# Patient Record
Sex: Female | Born: 1945 | Race: White | Hispanic: No | Marital: Married | Smoking: Current some day smoker
Health system: Southern US, Community
[De-identification: ages and names within clinical notes are randomized; demographics above are authoritative.]

## PROBLEM LIST (undated history)

## (undated) DIAGNOSIS — Z9289 Personal history of other medical treatment: Secondary | ICD-10-CM

## (undated) DIAGNOSIS — K219 Gastro-esophageal reflux disease without esophagitis: Secondary | ICD-10-CM

## (undated) DIAGNOSIS — I10 Essential (primary) hypertension: Secondary | ICD-10-CM

## (undated) DIAGNOSIS — I2510 Atherosclerotic heart disease of native coronary artery without angina pectoris: Secondary | ICD-10-CM

## (undated) DIAGNOSIS — F32A Depression, unspecified: Secondary | ICD-10-CM

## (undated) DIAGNOSIS — E785 Hyperlipidemia, unspecified: Secondary | ICD-10-CM

## (undated) DIAGNOSIS — F329 Major depressive disorder, single episode, unspecified: Secondary | ICD-10-CM

## (undated) DIAGNOSIS — S82401A Unspecified fracture of shaft of right fibula, initial encounter for closed fracture: Secondary | ICD-10-CM

## (undated) DIAGNOSIS — F17200 Nicotine dependence, unspecified, uncomplicated: Secondary | ICD-10-CM

## (undated) DIAGNOSIS — M797 Fibromyalgia: Secondary | ICD-10-CM

## (undated) HISTORY — DX: Personal history of other medical treatment: Z92.89

## (undated) HISTORY — DX: Nicotine dependence, unspecified, uncomplicated: F17.200

## (undated) HISTORY — DX: Depression, unspecified: F32.A

## (undated) HISTORY — DX: Essential (primary) hypertension: I10

## (undated) HISTORY — DX: Unspecified fracture of shaft of right fibula, initial encounter for closed fracture: S82.401A

## (undated) HISTORY — DX: Fibromyalgia: M79.7

## (undated) HISTORY — DX: Gastro-esophageal reflux disease without esophagitis: K21.9

## (undated) HISTORY — DX: Major depressive disorder, single episode, unspecified: F32.9

## (undated) HISTORY — DX: Atherosclerotic heart disease of native coronary artery without angina pectoris: I25.10

## (undated) HISTORY — DX: Hyperlipidemia, unspecified: E78.5

---

## 1999-07-12 ENCOUNTER — Encounter: Payer: Self-pay | Admitting: Obstetrics and Gynecology

## 1999-07-12 ENCOUNTER — Encounter: Admission: RE | Admit: 1999-07-12 | Discharge: 1999-07-12 | Payer: Self-pay | Admitting: Obstetrics and Gynecology

## 2000-10-30 ENCOUNTER — Encounter: Admission: RE | Admit: 2000-10-30 | Discharge: 2000-10-30 | Payer: Self-pay | Admitting: Obstetrics and Gynecology

## 2000-10-30 ENCOUNTER — Encounter: Payer: Self-pay | Admitting: Obstetrics and Gynecology

## 2001-11-26 ENCOUNTER — Encounter: Payer: Self-pay | Admitting: Obstetrics and Gynecology

## 2001-11-26 ENCOUNTER — Encounter: Admission: RE | Admit: 2001-11-26 | Discharge: 2001-11-26 | Payer: Self-pay | Admitting: Obstetrics and Gynecology

## 2001-12-22 ENCOUNTER — Ambulatory Visit (HOSPITAL_COMMUNITY): Admission: RE | Admit: 2001-12-22 | Discharge: 2001-12-23 | Payer: Self-pay | Admitting: Cardiology

## 2002-12-08 ENCOUNTER — Encounter: Payer: Self-pay | Admitting: Obstetrics and Gynecology

## 2002-12-08 ENCOUNTER — Encounter: Admission: RE | Admit: 2002-12-08 | Discharge: 2002-12-08 | Payer: Self-pay | Admitting: Obstetrics and Gynecology

## 2003-08-01 ENCOUNTER — Emergency Department (HOSPITAL_COMMUNITY): Admission: EM | Admit: 2003-08-01 | Discharge: 2003-08-01 | Payer: Self-pay | Admitting: Emergency Medicine

## 2003-08-11 ENCOUNTER — Encounter: Admission: RE | Admit: 2003-08-11 | Discharge: 2003-08-11 | Payer: Self-pay | Admitting: Internal Medicine

## 2004-01-11 ENCOUNTER — Ambulatory Visit (HOSPITAL_COMMUNITY): Admission: RE | Admit: 2004-01-11 | Discharge: 2004-01-11 | Payer: Self-pay | Admitting: Obstetrics and Gynecology

## 2004-10-03 ENCOUNTER — Ambulatory Visit (HOSPITAL_COMMUNITY): Admission: RE | Admit: 2004-10-03 | Discharge: 2004-10-03 | Payer: Self-pay | Admitting: Gastroenterology

## 2005-02-19 ENCOUNTER — Ambulatory Visit (HOSPITAL_COMMUNITY): Admission: RE | Admit: 2005-02-19 | Discharge: 2005-02-19 | Payer: Self-pay | Admitting: Obstetrics and Gynecology

## 2006-03-28 ENCOUNTER — Ambulatory Visit (HOSPITAL_COMMUNITY): Admission: RE | Admit: 2006-03-28 | Discharge: 2006-03-28 | Payer: Self-pay | Admitting: Obstetrics and Gynecology

## 2006-10-29 ENCOUNTER — Ambulatory Visit: Payer: Self-pay | Admitting: Vascular Surgery

## 2006-10-29 ENCOUNTER — Encounter: Payer: Self-pay | Admitting: Vascular Surgery

## 2006-10-29 ENCOUNTER — Ambulatory Visit (HOSPITAL_COMMUNITY): Admission: RE | Admit: 2006-10-29 | Discharge: 2006-10-29 | Payer: Self-pay | Admitting: Cardiology

## 2007-05-15 ENCOUNTER — Ambulatory Visit (HOSPITAL_COMMUNITY): Admission: RE | Admit: 2007-05-15 | Discharge: 2007-05-15 | Payer: Self-pay | Admitting: Obstetrics and Gynecology

## 2007-12-22 ENCOUNTER — Inpatient Hospital Stay (HOSPITAL_COMMUNITY): Admission: AD | Admit: 2007-12-22 | Discharge: 2007-12-24 | Payer: Self-pay | Admitting: Cardiology

## 2008-01-26 ENCOUNTER — Inpatient Hospital Stay (HOSPITAL_COMMUNITY): Admission: RE | Admit: 2008-01-26 | Discharge: 2008-01-27 | Payer: Self-pay | Admitting: Cardiology

## 2008-07-01 ENCOUNTER — Ambulatory Visit (HOSPITAL_COMMUNITY): Admission: RE | Admit: 2008-07-01 | Discharge: 2008-07-01 | Payer: Self-pay | Admitting: Obstetrics and Gynecology

## 2009-07-21 ENCOUNTER — Ambulatory Visit (HOSPITAL_COMMUNITY): Admission: RE | Admit: 2009-07-21 | Discharge: 2009-07-21 | Payer: Self-pay | Admitting: Obstetrics and Gynecology

## 2010-07-26 ENCOUNTER — Ambulatory Visit (HOSPITAL_COMMUNITY)
Admission: RE | Admit: 2010-07-26 | Discharge: 2010-07-26 | Payer: Self-pay | Source: Home / Self Care | Attending: Obstetrics and Gynecology | Admitting: Obstetrics and Gynecology

## 2010-12-25 NOTE — Discharge Summary (Signed)
[name], [name]              ACCOUNT NO.:  [id number]   MEDICAL RECORD NO.:  [id number]          PATIENT TYPE:  INP   LOCATION:  6526                         FACILITY:  [facility]   PHYSICIAN:  Francisca December, M.D.  DATE OF BIRTH:  1946/03/10   DATE OF ADMISSION:  12/22/2007  DATE OF DISCHARGE:  12/24/2007                               DISCHARGE SUMMARY   DISCHARGE DIAGNOSES:  1. Coronary artery disease status post drug-eluting stent to the      proximal left anterior descending, mid left anterior descending,      and proximal ramus.  2. Residual right coronary artery disease.  3. Hypertension.  4. Gastroesophageal reflux disease.  5. Tobacco abuse, smoking cessation counseling provided.  6. Dyslipidemia.  7. Fibromyalgia.  8. Long-term medication use.   HOSPITAL COURSE:  [name] is a 65 year old female that was admitted  on Dec 22, 2007 after having complaints of worsening shortness of  breath.  The shortness of breath was worse with activity.  She denied  any chest pain.  Her symptoms prompted a stress Cardiolite, which  revealed mild ischemia in the anterior lateral and apical lateral  regions.  Therefore, she was admitted for catheterization.  She was  found to have 85% proximal LAD stenosis, 70% mid LAD stenosis, and 90%  ramus stenosis.  Drug-eluting stents were placed into these regions  without difficulty.  The patient does have residual right coronary  artery disease and plans are for her to come back in few weeks to have  this area intervened upon.   During that time, the patient's hospitalization was uneventful.  Lab  studies showed hemoglobin 12.1, hematocrit 35.6, BUN 9, creatinine 0.65,  potassium 4.3.  She was discharged home on her home medications  including Plavix 75 mg a day, estradiol daily, Prozac 20 mg a day,  enteric-coated aspirin 325 mg daily,  Lipitor 80 mg a day, fenofibrate  daily, sublingual nitroglycerin p.r.n. chest pain.   The patient is  instructed not to lift over 10 pounds for 1 week.  No  driving for 2 days.  Increase activity slowly.  Remain on a low-sodium  heart-healthy diet.  Clean over cath site gently with soap and water.  No scrubbing.  Follow with Dr. Deitra Mayo, nurse practitioner  on Jan 07, 2008 at 11 a.m.   The patient will need a workup for percutaneous intervention of the  right coronary artery upon her visit.  At that visit, she will need to  have the procedure scheduled under the care of Dr. Amil Amen.      Guy Franco, P.A.      Francisca December, M.D.  Electronically Signed    LB/MEDQ  D:  12/24/2007  T:  12/24/2007  Job:  [id number]

## 2010-12-28 NOTE — Cardiovascular Report (Signed)
[place]. [facility]  Patient:    [name], [name] Visit Number: [id number] MRN: [id number]          Service Type: CAT Location: 6500 [id number] Attending Physician:  Corliss Marcus Dictated by:   Francisca December, M.D. Proc. Date: 12/22/01 Admit Date:  12/22/2001 Discharge Date: 12/23/2001   CC:         Lilla Shook, M.D.  Cardiac Catheterization Laboratory   Cardiac Catheterization  PROCEDURES PERFORMED: 1. Left heart catheterization. 2. Coronary angiography. 3. Left ventriculogram. 4. PTCA/stent implantation, proximal anterior descending artery.  INDICATION:   [name] is a 65 year old woman who recently developed exertional and then rest anterior chest tightness.  She underwent an exercise treadmill test yesterday, which was positive early for the reproduction of symptoms and ischemic electrocardiographic changes.  Associated myocardial perfusion images revealed a reversible anterior and apical defect.  She is brought now to the catheterization laboratory to identify the extent of disease and provide for further therapeutic options.  PROCEDURAL NOTE:  Left heart catheterization was performed following the percutaneous insertion of a 5-French catheter sheath, utilizing an anterior approach over a guiding J wire into the right femoral artery.  A 110 cm pigtail catheter was used to measure pressures in the ascending aorta and in the left ventricle, both prior to and following the ventriculogram.  A 30-degree RAO cine left ventriculogram was performed utilizing a power injector.  Following the sublingual administration of 0.4 mg nitroglycerin, cineangiography of each coronary artery was conducted in LAO and RAO projections, utilizing 5-French #4 left and right Judkins catheters.  We then proceeded with coronary intervention.  The 5-French catheter sheath was exchanged over a long guiding J wire for a 6-French catheter sheath.  The patient  received 48 mg of IV Angiomax.  A 6-French Medtronic launcher Q-3.5 guiding catheter was advanced to the ascending aorta, where the left coronary os was engaged.  A 0.014 inch SciMed luge intracoronary guide wire was passed across the lesion in the proximal to mid portion of the LAD, with minimal difficulty.  Initial balloon dilatation was performed using a 3.0 x 20 mm SciMed Maverick intracoronary balloon.  This was inflated to 6 atm on three occasions, for not greater than 53 sec.  This balloon was removed and a 3.0 x 18 mm Cordis Cypher drug alluding stent was then advanced into position in the mid and proximal anterior descending artery.  After carefully positioning and reviewing multiple angulations, the stent was deployed to a peak pressure of 16 atm for approximately 1.25 min.  The stent balloon was then removed and a 3.25 x 15 mm SciMed Quantum Maverick was advanced into position, carefully centered within the stent.  This device was inflated to 18 atm for approximately one minute.  The Quantum Maverick balloon was removed, and following intracoronary administration of 150 mcg of nitroglycerin, cineangiography was repeated.  The guide wire was then removed and adequate patency was then confirmed in orthogonal views, and the guiding catheter was removed.  Hemostasis was achieved by use of the Perclose system. There was good hemostasis.  A sterile dressing was applied.  The patient was transported to the recovery area in stable condition.  HEMODYNAMICS: 1. Systemic arterial pressure:  133/80 mmHg.  Mean 102 mmHg.  There was no    systolic gradient across the aortic valve. 2. Left ventricular end-diastolic pressure:  12 mmHg (pre and post    ventriculogram).  LEFT VENTRICULOGRAPHY:  The left ventriculogram demonstrated  normal chamber size and normal global systolic function.  Calculated ejection fraction is 76%.  There was no mitral regurgitation and there was no  coronary calcification seen.  CORONARY ANGIOGRAPHY:  There was a right dominant coronary system present. 1. LEFT MAIN CORONARY ARTERY:  Main is short and normal. 2. LEFT ANTERIOR DESCENDING ARTERY (and its branches):  Highly diseased; there    was a proximal 12-15 mm in length stenosis, which developed in the very    proximal portion and continued to end abruptly right after the first septal    perforator.  The ongoing anterior descending artery was moderate in size,    tortuous and without significant obstruction.  The lesion did involve the    ostium of the first septal perforator, which is 90% stenotic, as was the    anterior descending artery itself.  A single small diagonal branch arises    without significant obstruction. 3. LEFT CIRCUMFLEX ARTERY (and its branches):  Consisted of a very large ramus    intermedius, with multiple subbranches, and a rather small ongoing    circumflex giving rise to a single true obtuse marginal branch.  No    obstructions were seen within this vessel. 4. RIGHT CORONARY ARTERY (and its branches):   Normal.  No luminal    irregularities are seen.  There is a large posterior descending artery and    a large posterolateral segment, and two left ventricular branches (the    second of which is moderate in size).  Collateral vessels are not seen.  FINAL IMPRESSION: 1. Atherosclerotic coronary vascular disease, severe single vessel. 2. Intact left ventricular size and global systolic function. 3. Status post successful PTCA and drug alluding stent implantation,    proximal and mid left anterior descending artery. 4. Typical angina was reproduced with the insertion of balloon inflation. Dictated by:   Francisca December, M.D. Attending Physician:  Corliss Marcus DD:  12/22/01 TD:  12/22/01 Job: [id number]

## 2010-12-28 NOTE — Op Note (Signed)
NAME:  [name], [name] NO.:  [id number]   MEDICAL RECORD NO.:  [id number]          PATIENT TYPE:  AMB   LOCATION:  ENDO                         FACILITY:  [facility]   PHYSICIAN:  Danise Edge, M.D.   DATE OF BIRTH:  November 07, 1945   DATE OF PROCEDURE:  10/03/2004  DATE OF DISCHARGE:                                 OPERATIVE REPORT   PROCEDURE:  Esophagogastroduodenoscopy and colonoscopy   INDICATIONS:  [name] is a 65 year old female born September 23, 1945. [name] passes fresh blood when she developed constipation. She  has chronic gastroesophageal reflux controlled only with p.r.n. Tums. She  reports no dysphagia or odynophagia.   ENDOSCOPIST:  Danise Edge, M.D.   PREMEDICATION:  Versed 7 mg , Demerol 60 mg.   PROCEDURE:  Diagnostic esophagogastroduodenoscopy. After obtaining informed  consent [name] was placed in the left lateral decubitus position. I  administered intravenous Demerol and intravenous Versed to achieve conscious  sedation for the procedure. The patient's blood pressure, oxygen saturation  and cardiac rhythm were monitored throughout the procedure and documented in  the medical record.   The Olympus gastroscope was passed through the posterior hypopharynx into  the proximal esophagus without difficulty. The hypopharynx, larynx and vocal  cords appeared normal.   Esophagoscopy: The proximal mid and lower segments of the esophageal mucosa  appeared normal. There is no endoscopic evidence for the presence of  Barrett's esophagus or erosive esophagitis. The squamocolumnar junction and  esophagogastric junction are noted at 37 cm from the incisor teeth.   Gastroscopy: Retroflex view of the gastric cardia and fundus was normal.  There is no evidence for the presence of a hiatal hernia. The gastric body,  antrum and pylorus appear normal.   Duodenoscopy: The duodenal bulb, mid duodenum and distal duodenum appeared   normal.   ASSESSMENT:  Normal esophagogastroduodenoscopy.   PROCEDURE:  Proctocolonoscopy to the cecum. Anal inspection and digital  rectal exam were normal. The Olympus adjustable pediatric colonoscope was  introduced into the rectum and advanced to the cecum. Colonic preparation  exam today was excellent.   Rectum normal. [name] does have large nonbleeding internal hemorrhoids  which are probably the etiology of her intermittent painless hematochezia  when constipated.  Sigmoid colon and descending colon. A few small shallow diverticula are  present in the left colon.  Splenic flexure normal.  Transverse colon normal.  Hepatic flexure normal.  Ascending colon normal.  Cecum and ileocecal valve normal.   ASSESSMENT:  1.  Large nonbleeding internal hemorrhoids.  2.  A few small shallow diverticula are noted in the left colon.  3.  Otherwise normal proctocolonoscopy to cecum      MJ/MEDQ  D:  10/03/2004  T:  10/03/2004  Job:  [id number]

## 2011-05-08 LAB — CBC
HCT: 35.6 — ABNORMAL LOW
HCT: 36.6
Hemoglobin: 12.1
Hemoglobin: 12.4
MCHC: 33.9
MCHC: 34.1
MCV: 93.1
MCV: 93.4
Platelets: 246
Platelets: 259
RBC: 3.81 — ABNORMAL LOW
RBC: 3.93
RDW: 12.4
RDW: 12.4
WBC: 4.3
WBC: 5.7

## 2011-05-08 LAB — BASIC METABOLIC PANEL
BUN: 12
BUN: 9
CO2: 24
CO2: 26
Calcium: 8.4
Calcium: 8.5
Chloride: 104
Chloride: 108
Creatinine, Ser: 0.65
Creatinine, Ser: 0.7
GFR calc Af Amer: >60
GFR calc Af Amer: >60
GFR calc non Af Amer: >60
GFR calc non Af Amer: >60
Glucose, Bld: 95
Glucose, Bld: 99
Potassium: 4.2
Potassium: 4.3
Sodium: 136
Sodium: 138

## 2011-05-08 LAB — PROTIME-INR
INR: 1
Prothrombin Time: 13.3

## 2011-05-08 LAB — HEMOGLOBIN A1C
Hgb A1c MFr Bld: 5.4
Mean Plasma Glucose: 115

## 2011-05-09 LAB — BASIC METABOLIC PANEL
BUN: 11
CO2: 28
Calcium: 8.6
Chloride: 106
Creatinine, Ser: 0.75
GFR calc Af Amer: >60
GFR calc non Af Amer: >60
Glucose, Bld: 100 — ABNORMAL HIGH
Potassium: 5
Sodium: 137

## 2011-05-09 LAB — CBC
MCHC: 34.2
Platelets: 290
RDW: 12.4

## 2011-08-07 ENCOUNTER — Other Ambulatory Visit (HOSPITAL_COMMUNITY): Payer: Self-pay | Admitting: Internal Medicine

## 2011-08-07 DIAGNOSIS — Z1231 Encounter for screening mammogram for malignant neoplasm of breast: Secondary | ICD-10-CM

## 2011-08-09 ENCOUNTER — Ambulatory Visit (HOSPITAL_COMMUNITY)
Admission: RE | Admit: 2011-08-09 | Discharge: 2011-08-09 | Disposition: A | Discharge status: Home / Self Care | Payer: Medicare Other | Source: Ambulatory Visit | Attending: Internal Medicine | Admitting: Internal Medicine

## 2011-08-09 DIAGNOSIS — Z1231 Encounter for screening mammogram for malignant neoplasm of breast: Secondary | ICD-10-CM | POA: Insufficient documentation

## 2012-10-27 ENCOUNTER — Other Ambulatory Visit (HOSPITAL_COMMUNITY): Payer: Self-pay | Admitting: Internal Medicine

## 2012-10-27 DIAGNOSIS — Z1231 Encounter for screening mammogram for malignant neoplasm of breast: Secondary | ICD-10-CM

## 2012-11-12 ENCOUNTER — Ambulatory Visit (HOSPITAL_COMMUNITY)
Admission: RE | Admit: 2012-11-12 | Discharge: 2012-11-12 | Disposition: A | Discharge status: Home / Self Care | Payer: Medicare Other | Source: Ambulatory Visit | Attending: Internal Medicine | Admitting: Internal Medicine

## 2012-11-12 DIAGNOSIS — Z1231 Encounter for screening mammogram for malignant neoplasm of breast: Secondary | ICD-10-CM | POA: Insufficient documentation

## 2013-05-20 ENCOUNTER — Encounter: Payer: Self-pay | Admitting: Interventional Cardiology

## 2013-05-28 ENCOUNTER — Telehealth: Payer: Self-pay | Admitting: Cardiology

## 2013-05-28 ENCOUNTER — Telehealth: Payer: Self-pay | Admitting: Interventional Cardiology

## 2013-05-28 DIAGNOSIS — E782 Mixed hyperlipidemia: Secondary | ICD-10-CM

## 2013-05-28 MED ORDER — NITROGLYCERIN 0.4 MG SL SUBL: 0.4000 mg | SUBLINGUAL_TABLET | SUBLINGUAL | Status: DC | PRN

## 2013-05-28 NOTE — Telephone Encounter (Signed)
Called pt back regarding lab results. 

## 2013-05-28 NOTE — Telephone Encounter (Signed)
Pt notified of cholesterol lab results, meds updated and labs ordered.

## 2013-05-28 NOTE — Telephone Encounter (Signed)
New message    Returned someone's call from yesterday----she is assuming it was Dr Hoyle Barr nurse

## 2013-05-28 NOTE — Telephone Encounter (Signed)
Message copied by Theda Sers on Fri May 28, 2013 12:06 PM ------      Message from: SMART, Gaspar Skeeters      Created: Fri May 28, 2013 11:56 AM       Have patient increase fish oil to 4,000 mg daily (1000 mg capsule - 4 per day).  Continue lipitor 80 mg qd.  Recheck NMR and ALT in 6 months.  Thank you. ------

## 2013-11-24 ENCOUNTER — Encounter: Payer: Self-pay | Admitting: Interventional Cardiology

## 2013-11-25 ENCOUNTER — Other Ambulatory Visit (HOSPITAL_COMMUNITY): Payer: Self-pay | Admitting: Internal Medicine

## 2013-11-25 DIAGNOSIS — Z1231 Encounter for screening mammogram for malignant neoplasm of breast: Secondary | ICD-10-CM

## 2013-11-26 ENCOUNTER — Ambulatory Visit (HOSPITAL_COMMUNITY)
Admission: RE | Admit: 2013-11-26 | Discharge: 2013-11-26 | Disposition: A | Discharge status: Home / Self Care | Payer: Medicare Other | Source: Ambulatory Visit | Attending: Internal Medicine | Admitting: Internal Medicine

## 2013-11-26 DIAGNOSIS — Z1231 Encounter for screening mammogram for malignant neoplasm of breast: Secondary | ICD-10-CM

## 2013-11-29 ENCOUNTER — Other Ambulatory Visit (INDEPENDENT_AMBULATORY_CARE_PROVIDER_SITE_OTHER): Payer: Medicare Other

## 2013-11-29 DIAGNOSIS — E782 Mixed hyperlipidemia: Secondary | ICD-10-CM

## 2013-11-29 LAB — ALT: ALT: 16 U/L (ref 0–35)

## 2013-11-30 LAB — NMR LIPOPROFILE WITH LIPIDS
CHOLESTEROL, TOTAL: 138 mg/dL (ref <200)
HDL PARTICLE NUMBER: 36 umol/L (ref >=30.5)
HDL Size: 9.4 nm (ref >=9.2)
HDL-C: 61 mg/dL (ref >=40)
LARGE HDL: 12.3 umol/L (ref >=4.8)
LARGE VLDL-P: 4.6 nmol/L — AB (ref <=2.7)
LDL (calc): 29 mg/dL (ref <100)
LDL PARTICLE NUMBER: 580 nmol/L (ref <1000)
LDL Size: 20.3 nm — ABNORMAL LOW (ref >20.5)
LP-IR Score: 41 (ref <=45)
Small LDL Particle Number: 360 nmol/L (ref <=527)
TRIGLYCERIDES: 242 mg/dL — AB (ref <150)
VLDL SIZE: 46.6 nm (ref <=46.6)

## 2013-12-03 ENCOUNTER — Other Ambulatory Visit: Payer: Self-pay | Admitting: Cardiology

## 2013-12-03 DIAGNOSIS — E782 Mixed hyperlipidemia: Secondary | ICD-10-CM

## 2013-12-28 ENCOUNTER — Ambulatory Visit: Payer: Medicare Other | Admitting: Interventional Cardiology

## 2014-01-06 ENCOUNTER — Telehealth: Payer: Self-pay | Admitting: *Deleted

## 2014-01-06 MED ORDER — LISINOPRIL 20 MG PO TABS: 20.0000 mg | ORAL_TABLET | Freq: Every day | ORAL | Status: DC

## 2014-01-06 NOTE — Telephone Encounter (Signed)
Refilled

## 2014-01-06 NOTE — Telephone Encounter (Signed)
[facility] in [place] requests lisinopril refill for patient. They are requesting #[phone]. Thanks, MI

## 2014-02-01 ENCOUNTER — Encounter: Payer: Self-pay | Admitting: Cardiology

## 2014-02-04 ENCOUNTER — Ambulatory Visit: Payer: Medicare Other | Admitting: Interventional Cardiology

## 2014-02-07 ENCOUNTER — Ambulatory Visit: Payer: Medicare Other | Admitting: Interventional Cardiology

## 2014-04-07 ENCOUNTER — Ambulatory Visit: Payer: Medicare Other | Admitting: Interventional Cardiology

## 2014-05-05 ENCOUNTER — Other Ambulatory Visit: Payer: Self-pay | Admitting: *Deleted

## 2014-05-05 MED ORDER — ATORVASTATIN CALCIUM 80 MG PO TABS: 80.0000 mg | ORAL_TABLET | Freq: Every day | ORAL | Status: DC

## 2014-05-17 ENCOUNTER — Ambulatory Visit (INDEPENDENT_AMBULATORY_CARE_PROVIDER_SITE_OTHER): Payer: Medicare Other | Admitting: Interventional Cardiology

## 2014-05-17 ENCOUNTER — Encounter: Payer: Self-pay | Admitting: Interventional Cardiology

## 2014-05-17 VITALS — BP 118/70 | HR 61 | Ht 62.0 in | Wt 151.6 lb

## 2014-05-17 DIAGNOSIS — E782 Mixed hyperlipidemia: Secondary | ICD-10-CM

## 2014-05-17 DIAGNOSIS — Z720 Tobacco use: Secondary | ICD-10-CM | POA: Insufficient documentation

## 2014-05-17 DIAGNOSIS — I2510 Atherosclerotic heart disease of native coronary artery without angina pectoris: Secondary | ICD-10-CM | POA: Insufficient documentation

## 2014-05-17 MED ORDER — ASPIRIN 81 MG PO TABS: 81.0000 mg | ORAL_TABLET | Freq: Every day | ORAL | Status: DC

## 2014-05-17 NOTE — Progress Notes (Signed)
Patient ID: [name], female   DOB: 1945-12-21, 68 y.o.   MRN: [id number]    [place], [address] [place],   [address] Phone: [phone] Fax:  [phone]  Date:  05/17/2014   ID:  [name], DOB [birth year], MRN [id number]  PCP:  Irven Shelling, MD      History of Present Illness: [name] is a 68 y.o. female who has had multivessel stents placed, mostly in 2009. She had SOB prior to her stents. She did have chest pain prior to the the first stent being placed. She woke up with chest pain and the other was [facility] with exertion back in 2003.  No chest pain recently. Mild fatigue. She walked for exercise 3 x/week for about one hour. Mild DOE with walking hills.  Much less exercise recently since her husband has been sick with cancer. CAD/ASCVD:  left leg pain improved. she has some mild fatigue with exercise. c/o Dyspnea on exertion up hill,, no change.  Denies : Chest pain.  Diaphoresis.  Dizziness.  Leg edema.  Nitroglycerin.  Orthopnea.  Paroxysmal nocturnal dyspnea.  Syncope.     Wt Readings from Last 3 Encounters:  05/17/14 151 lb 9.6 oz (68.765 kg)     Past Medical History  Diagnosis Date  . ASCVD (arteriosclerotic cardiovascular disease)     multivessel, s/p DE stent, LAD proximal and mid, large proximal ramus 12/23/2007---s/p DE stent implant, mid and proximal RCA 01/26/08  . H/O cardiovascular stress test     abnormal  . Dyslipidemia   . Depression   . Coronary atherosclerosis of native coronary artery   . Essential hypertension, benign   . GERD (gastroesophageal reflux disease)   . Fibromyalgia syndrome   . Tobacco use disorder   . Fracture of right fibula     11/12    Current Outpatient Prescriptions  Medication Sig Dispense Refill  . aspirin 325 MG tablet Take 325 mg by mouth daily.      Marland Kitchen atorvastatin (LIPITOR) 80 MG tablet Take 1 tablet (80 mg total) by mouth daily.  30 tablet  1  . cholecalciferol (VITAMIN D) 1000  UNITS tablet Take 1,000 Units by mouth daily.      . clopidogrel (PLAVIX) 75 MG tablet Take 75 mg by mouth daily with breakfast.      . estropipate (OGEN) 3 MG tablet Take 3 mg by mouth daily.      . fish oil-omega-3 fatty acids 1000 MG capsule Take 2 g by mouth 2 (two) times daily.      Marland Kitchen FLUoxetine (PROZAC) 20 MG capsule Take 20 mg by mouth daily.      Marland Kitchen HYDROcodone-acetaminophen (NORCO/VICODIN) 5-325 MG per tablet Take 1 tablet by mouth every 6 (six) hours as needed for moderate pain.      Marland Kitchen lisinopril (PRINIVIL,ZESTRIL) 20 MG tablet Take 1 tablet (20 mg total) by mouth daily.  90 tablet  0  . LORazepam (ATIVAN) 1 MG tablet Take 1 mg by mouth daily. Take 0.5 in the morning and 0.5 in the evening.      . nitroGLYCERIN (NITROSTAT) 0.4 MG SL tablet Place 1 tablet (0.4 mg total) under the tongue every 5 (five) minutes as needed for chest pain.  25 tablet  5   No current facility-administered medications for this visit.    Allergies:    Allergies  Allergen Reactions  . Morphine And Related     Upset stomach  Social History:  The patient  reports that she has been smoking.  She does not have any smokeless tobacco history on file. She reports that she drinks alcohol. She reports that she does not use illicit drugs.   Family History:  The patient's family history includes CAD in her father; CVA in her brother and father; Hypertension in her brother; Ovarian cancer in her mother.   ROS:  Please see the history of present illness.  No nausea, vomiting.  No fevers, chills.  No focal weakness.  No dysuria.    All other systems reviewed and negative.   PHYSICAL EXAM: VS:  BP 118/70  Pulse 61  Ht 5\' 2"  (1.575 m)  Wt 151 lb 9.6 oz (68.765 kg)  BMI 27.72 kg/m2 Well nourished, well developed, in no acute distress HEENT: normal Neck: no JVD, no carotid bruits Cardiac:  normal S1, S2; RRR;  Lungs:  clear to auscultation bilaterally, no wheezing, rhonchi or rales Abd: soft, nontender, no  hepatomegaly Ext: no edema, 3+ left DP , right PT 2+ Skin: warm and dry Neuro:   no focal abnormalities noted  EKG:  NSR, small inferior Q waves (insignificant)     ASSESSMENT AND PLAN:  Coronary atherosclerosis of native coronary artery  Refill Nitroglycerin 0.4 mg tablet, 0.4 mg, 1 tablet as directed, SL, as directed prn chest pain, 30 days, 25, Refills 3 Continue Plavix Tablet, 75 MG, TAKE 1 TABLET BY MOUTH EVERY DAY Decrease Aspirin Tablet, 81 MG, 1 tablet, Orally, Once a day; consider stopping aspirin all together in the future  EKG    12/29/2012 10:16:21 AM > Lukisha Procida,JAY 12/29/2012 10:31:15 AM > NSR, nonspecific inferior ST changes       Notes: DOE, less prominent. Continue aggressive secondary prevention.  Negative cardiolite in 5/14. Stress test result reviewed personally.  2. Nondependent tobacco use disorder  Notes: encouraged her to stop smoking. She says sh smokes very little but it is a stress reliever. 3. Mixed hyperlipidemia  Continue Lipitor Tablet, 80 MG, 1 tablet, Orally, Once a day Notes: LDL -p 580 at last check. Continue to watch carbohydrate intake.   Try to increase exercise.  Signed, Mina Marble, MD, [facility] 05/17/2014 9:19 AM

## 2014-05-17 NOTE — Patient Instructions (Signed)
Your physician has recommended you make the following change in your medication:   1. Stop Aspirin 325 mg.   2. Start Aspirin 81 mg 1 tablet daily.   Your physician wants you to follow-up in: 1 year with Dr. Irish Lack. You will receive a reminder letter in the mail two months in advance. If you don't receive a letter, please call our office to schedule the follow-up appointment.

## 2014-05-27 ENCOUNTER — Encounter: Payer: Self-pay | Admitting: Interventional Cardiology

## 2014-05-31 ENCOUNTER — Other Ambulatory Visit (INDEPENDENT_AMBULATORY_CARE_PROVIDER_SITE_OTHER): Payer: Medicare Other | Admitting: *Deleted

## 2014-05-31 DIAGNOSIS — E782 Mixed hyperlipidemia: Secondary | ICD-10-CM

## 2014-05-31 LAB — HEPATIC FUNCTION PANEL
ALBUMIN: 3.3 g/dL — AB (ref 3.5–5.2)
ALK PHOS: 59 U/L (ref 39–117)
ALT: 12 U/L (ref 0–35)
AST: 17 U/L (ref 0–37)
Bilirubin, Direct: 0 mg/dL (ref 0.0–0.3)
Total Bilirubin: 0.5 mg/dL (ref 0.2–1.2)
Total Protein: 6.8 g/dL (ref 6.0–8.3)

## 2014-06-02 LAB — NMR LIPOPROFILE WITH LIPIDS
CHOLESTEROL, TOTAL: 142 mg/dL (ref 100–199)
HDL Particle Number: 33.7 umol/L (ref >=30.5)
HDL Size: 9.3 nm (ref >=9.2)
HDL-C: 59 mg/dL (ref >39)
LDL CALC: 32 mg/dL (ref 0–99)
LDL PARTICLE NUMBER: 607 nmol/L (ref <1000)
LDL Size: 20.5 nm (ref >=20.8)
LP-IR SCORE: 55 — AB (ref <=45)
Large HDL-P: 8.9 umol/L (ref >=4.8)
Large VLDL-P: 13.7 nmol/L — ABNORMAL HIGH (ref <=2.7)
SMALL LDL PARTICLE NUMBER: 198 nmol/L (ref <=527)
Triglycerides: 256 mg/dL — ABNORMAL HIGH (ref 0–149)
VLDL SIZE: 54.3 nm — AB (ref <=46.6)

## 2014-06-15 ENCOUNTER — Other Ambulatory Visit: Payer: Self-pay | Admitting: *Deleted

## 2014-06-15 DIAGNOSIS — E782 Mixed hyperlipidemia: Secondary | ICD-10-CM

## 2014-12-12 ENCOUNTER — Other Ambulatory Visit (INDEPENDENT_AMBULATORY_CARE_PROVIDER_SITE_OTHER): Payer: Medicare Other | Admitting: *Deleted

## 2014-12-12 DIAGNOSIS — E782 Mixed hyperlipidemia: Secondary | ICD-10-CM | POA: Diagnosis not present

## 2014-12-12 LAB — HEPATIC FUNCTION PANEL
ALK PHOS: 65 U/L (ref 39–117)
ALT: 14 U/L (ref 0–35)
AST: 13 U/L (ref 0–37)
Albumin: 3.8 g/dL (ref 3.5–5.2)
BILIRUBIN DIRECT: 0.1 mg/dL (ref 0.0–0.3)
TOTAL PROTEIN: 6.7 g/dL (ref 6.0–8.3)
Total Bilirubin: 0.4 mg/dL (ref 0.2–1.2)

## 2014-12-12 LAB — LIPID PANEL
CHOLESTEROL: 158 mg/dL (ref 0–200)
HDL: 75.6 mg/dL (ref >39.00)
LDL CALC: 54 mg/dL (ref 0–99)
NonHDL: 82.4
Total CHOL/HDL Ratio: 2
Triglycerides: 140 mg/dL (ref 0.0–149.0)
VLDL: 28 mg/dL (ref 0.0–40.0)

## 2015-01-24 ENCOUNTER — Other Ambulatory Visit (HOSPITAL_COMMUNITY): Payer: Self-pay | Admitting: Internal Medicine

## 2015-01-24 DIAGNOSIS — Z1231 Encounter for screening mammogram for malignant neoplasm of breast: Secondary | ICD-10-CM

## 2015-01-30 ENCOUNTER — Ambulatory Visit (HOSPITAL_COMMUNITY)
Admission: RE | Admit: 2015-01-30 | Discharge: 2015-01-30 | Disposition: A | Discharge status: Home / Self Care | Payer: Medicare Other | Source: Ambulatory Visit | Attending: Internal Medicine | Admitting: Internal Medicine

## 2015-01-30 DIAGNOSIS — Z1231 Encounter for screening mammogram for malignant neoplasm of breast: Secondary | ICD-10-CM | POA: Diagnosis not present

## 2015-03-30 ENCOUNTER — Encounter: Payer: Self-pay | Admitting: Interventional Cardiology

## 2015-06-07 ENCOUNTER — Telehealth: Payer: Self-pay | Admitting: Interventional Cardiology

## 2015-06-07 NOTE — Telephone Encounter (Signed)
The pt is advised to come to her OV on 11/3 NPO (she states that she wants lab work done on same day as her OV with Dr Irish Lack and that she will not eat or drink until after lab work that morning). She verbalized understanding.

## 2015-06-07 NOTE — Telephone Encounter (Signed)
New Message  Pt has appointment next week- 11/3- pt wanted to know if there are fasting labs she needs to do sameday- no order in syst, Please call back and discuss,.

## 2015-06-15 ENCOUNTER — Ambulatory Visit (INDEPENDENT_AMBULATORY_CARE_PROVIDER_SITE_OTHER): Payer: Medicare Other | Admitting: Interventional Cardiology

## 2015-06-15 ENCOUNTER — Encounter: Payer: Self-pay | Admitting: Interventional Cardiology

## 2015-06-15 VITALS — BP 130/60 | HR 67 | Ht 62.0 in | Wt 153.0 lb

## 2015-06-15 DIAGNOSIS — I2510 Atherosclerotic heart disease of native coronary artery without angina pectoris: Secondary | ICD-10-CM

## 2015-06-15 DIAGNOSIS — Z720 Tobacco use: Secondary | ICD-10-CM | POA: Diagnosis not present

## 2015-06-15 DIAGNOSIS — E782 Mixed hyperlipidemia: Secondary | ICD-10-CM | POA: Diagnosis not present

## 2015-06-15 LAB — HEPATIC FUNCTION PANEL
ALBUMIN: 3.8 g/dL (ref 3.6–5.1)
ALT: 11 U/L (ref 6–29)
AST: 16 U/L (ref 10–35)
Alkaline Phosphatase: 66 U/L (ref 33–130)
BILIRUBIN INDIRECT: 0.2 mg/dL (ref 0.2–1.2)
Bilirubin, Direct: 0.1 mg/dL (ref <=0.2)
TOTAL PROTEIN: 6.3 g/dL (ref 6.1–8.1)
Total Bilirubin: 0.3 mg/dL (ref 0.2–1.2)

## 2015-06-15 LAB — COMPREHENSIVE METABOLIC PANEL
ALK PHOS: 66 U/L (ref 33–130)
ALT: 12 U/L (ref 6–29)
AST: 15 U/L (ref 10–35)
Albumin: 3.7 g/dL (ref 3.6–5.1)
BILIRUBIN TOTAL: 0.4 mg/dL (ref 0.2–1.2)
BUN: 11 mg/dL (ref 7–25)
CALCIUM: 8.8 mg/dL (ref 8.6–10.4)
CO2: 23 mmol/L (ref 20–31)
CREATININE: 0.7 mg/dL (ref 0.50–0.99)
Chloride: 102 mmol/L (ref 98–110)
GLUCOSE: 87 mg/dL (ref 65–99)
Potassium: 4.4 mmol/L (ref 3.5–5.3)
SODIUM: 134 mmol/L — AB (ref 135–146)
Total Protein: 6.3 g/dL (ref 6.1–8.1)

## 2015-06-15 LAB — LIPID PANEL
CHOL/HDL RATIO: 2.8 ratio (ref <=5.0)
Cholesterol: 143 mg/dL (ref 125–200)
HDL: 51 mg/dL (ref >=46)
LDL CALC: 29 mg/dL (ref <130)
TRIGLYCERIDES: 317 mg/dL — AB (ref <150)
VLDL: 63 mg/dL — AB (ref <30)

## 2015-06-15 NOTE — Patient Instructions (Signed)
Medication Instructions:  None  Labwork: CMET and Lipids today  Testing/Procedures: None  Follow-Up: Your physician wants you to follow-up in: 1 year with Dr. Irish Lack. You will receive a reminder letter in the mail or a phone call two months in advance. If you don't receive a letter or a phone call, please call our office to schedule the follow-up appointment.   Any Other Special Instructions Will Be Listed Below (If Applicable).  Your physician discussed the hazards of tobacco use. Tobacco use cessation is recommended and techniques and options to help you quit were discussed. Please try calling 1-800-QUIT-NOW.      If you need a refill on your cardiac medications before your next appointment, please call your pharmacy.

## 2015-06-15 NOTE — Progress Notes (Signed)
Patient ID: REIS PIENTA, female   DOB: 06-14-1946, 69 y.o.   MRN: [id number]     Cardiology Office Note   Date:  06/15/2015   ID:  Monique, Hoover Sep 11, 1945, MRN [id number]  PCP:  Irven Shelling, MD    No chief complaint on file.    Wt Readings from Last 3 Encounters:  06/15/15 153 lb (69.4 kg)  05/17/14 151 lb 9.6 oz (68.765 kg)       History of Present Illness: [name] is a 69 y.o. female  who has had multivessel stents placed, mostly in 2009. She had SOB prior to her stents. She did have chest pain prior to the the first stent being placed. She woke up with chest pain on one occasion before the stents, and the other was [facility] with exertion back in 2003.  No chest pain recently. Mild fatigue. She used to walk for exercise 3 x/week for about one hour- decreased since her husband's illness has gotten worse.   Mild DOE with walking hills persists- no change.   Much less exercise recently since her husband has been sick with cancer and getting chemotherapy. CAD/ASCVD:  left leg pain improved. she has some mild fatigue with exercise. Not limited by exertional leg pain. c/o Dyspnea on exertion up hill,, no change.  Denies : Chest pain.  Diaphoresis.  Dizziness.  Leg edema.  Nitroglycerin.  Orthopnea.  Paroxysmal nocturnal dyspnea.  Syncope.  No bleeding problems on DAPT.    Past Medical History  Diagnosis Date  . ASCVD (arteriosclerotic cardiovascular disease)     multivessel, s/p DE stent, LAD proximal and mid, large proximal ramus 12/23/2007---s/p DE stent implant, mid and proximal RCA 01/26/08  . H/O cardiovascular stress test     abnormal  . Dyslipidemia   . Depression   . Coronary atherosclerosis of native coronary artery   . Essential hypertension, benign   . GERD (gastroesophageal reflux disease)   . Fibromyalgia syndrome   . Tobacco use disorder   . Fracture of right fibula     11/12    History reviewed. No pertinent past surgical  history.   Current Outpatient Prescriptions  Medication Sig Dispense Refill  . aspirin 81 MG tablet Take 1 tablet (81 mg total) by mouth daily.    Marland Kitchen atorvastatin (LIPITOR) 80 MG tablet Take 1 tablet (80 mg total) by mouth daily. 30 tablet 1  . cholecalciferol (VITAMIN D) 1000 UNITS tablet Take 1,000 Units by mouth daily.    . clopidogrel (PLAVIX) 75 MG tablet Take 75 mg by mouth daily with breakfast.    . estropipate (OGEN) 3 MG tablet Take 3 mg by mouth daily.    . fish oil-omega-3 fatty acids 1000 MG capsule Take 2 g by mouth 2 (two) times daily.    Marland Kitchen FLUoxetine (PROZAC) 20 MG capsule Take 20 mg by mouth daily.    Marland Kitchen HYDROcodone-acetaminophen (NORCO/VICODIN) 5-325 MG per tablet Take 1 tablet by mouth every 6 (six) hours as needed for moderate pain.    Marland Kitchen lisinopril (PRINIVIL,ZESTRIL) 20 MG tablet Take 1 tablet (20 mg total) by mouth daily. 90 tablet 0  . LORazepam (ATIVAN) 1 MG tablet Take 1 mg by mouth daily. Take 0.5 in the morning and 0.5 in the evening.    . nitroGLYCERIN (NITROSTAT) 0.4 MG SL tablet Place 1 tablet (0.4 mg total) under the tongue every 5 (five) minutes as needed for chest pain. 25 tablet 5   No current facility-administered medications  for this visit.    Allergies:   Morphine and related    Social History:  The patient  reports that she has been smoking.  She does not have any smokeless tobacco history on file. She reports that she drinks alcohol. She reports that she does not use illicit drugs.   Family History:  The patient's family history includes CAD in her father; CVA in her brother and father; Heart attack in her sister; Heart failure in her father; Hypertension in her brother; Ovarian cancer in her mother.    ROS:  Please see the history of present illness.   Otherwise, review of systems are positive for DOE.   All other systems are reviewed and negative.    PHYSICAL EXAM: VS:  BP 130/60 mmHg  Pulse 67  Ht 5\' 2"  (1.575 m)  Wt 153 lb (69.4 kg)  BMI  27.98 kg/m2 , BMI Body mass index is 27.98 kg/(m^2). GEN: Well nourished, well developed, in no acute distress HEENT: normal Neck: no JVD, carotid bruits, or masses Cardiac: RRR; no murmurs, rubs, or gallops,no edema  Respiratory:  clear to auscultation bilaterally, normal work of breathing GI: soft, nontender, nondistended, + BS MS: no deformity or atrophy Skin: warm and dry, no rash Neuro:  Strength and sensation are intact Psych: euthymic mood, full affect   EKG:   The ekg ordered today demonstrates NSR, no ST segment changes   Recent Labs: 12/12/2014: ALT 14   Lipid Panel    Component Value Date/Time   CHOL 158 12/12/2014 0833   CHOL 142 05/31/2014 0853   TRIG 140.0 12/12/2014 0833   TRIG 256* 05/31/2014 0853   HDL 75.60 12/12/2014 0833   HDL 59 05/31/2014 0853   CHOLHDL 2 12/12/2014 0833   VLDL 28.0 12/12/2014 0833   LDLCALC 54 12/12/2014 0833   LDLCALC 32 05/31/2014 0853     Other studies Reviewed: Additional studies/ records that were reviewed today with results demonstrating: Normal nuclear stress test in 5/14.   ASSESSMENT AND PLAN:  1. CAD: Refill NTG.  Continue aspirin and Plavix.  No bleeding issues.  No angina. Continue aggressive secondary prevention. Negative cardiolite in 5/14. Stress test result reviewed personally.   2. Mixed hyperlipidemia:  Check labs today.  LDL Controlled in the past.  TG elevated at last check.   3.  Tobacco abuse:  The patient was counseled on the dangers of tobacco use, both inhaled and oral, which include, but are not limited to cardiovascular disease, increased cancer risk of multiple types of cancer, COPD, peripheral vascular disease, strokes. She was also counseled on the benefits of smoking cessation. The patient was firmly advised to quit.    We also reviewed strategies to maximize success, including: Removing cigarettes and smoking materials from environment Stress management Substitution of other forms of reinforcement  Support of family/friends. Selecting a quit date. Patient provided contact information for 1-800-QUIT-NOW     Current medicines are reviewed at length with the patient today.  The patient concerns regarding her medicines were addressed.  The following changes have been made:  No change  Labs/ tests ordered today include:  No orders of the defined types were placed in this encounter.    Recommend 150 minutes/week of aerobic exercise Low fat, low carb, high fiber diet recommended  Disposition:   FU in 1 year   Teresita Madura., MD  06/15/2015 10:53 AM    [facility] [address], [place], [place]  [address]  Phone: [phone]; Fax: [phone]

## 2015-06-26 ENCOUNTER — Other Ambulatory Visit: Payer: Self-pay

## 2015-06-26 DIAGNOSIS — E782 Mixed hyperlipidemia: Secondary | ICD-10-CM

## 2015-07-12 ENCOUNTER — Telehealth: Payer: Self-pay | Admitting: Interventional Cardiology

## 2015-07-12 NOTE — Telephone Encounter (Signed)
Pt is aware of lab results and MD and Pharmacist's recommendations. Pt is aware of her 6 months labs work on May 1st 2017.

## 2015-07-12 NOTE — Telephone Encounter (Signed)
New Message    Pt calling stating that she is returning Lynn's phone call. Please call back.

## 2015-12-11 ENCOUNTER — Other Ambulatory Visit (INDEPENDENT_AMBULATORY_CARE_PROVIDER_SITE_OTHER): Payer: Medicare Other

## 2015-12-11 DIAGNOSIS — E782 Mixed hyperlipidemia: Secondary | ICD-10-CM | POA: Diagnosis not present

## 2015-12-11 LAB — HEPATIC FUNCTION PANEL
ALK PHOS: 70 U/L (ref 33–130)
ALT: 14 U/L (ref 6–29)
AST: 15 U/L (ref 10–35)
Albumin: 4 g/dL (ref 3.6–5.1)
BILIRUBIN DIRECT: 0.1 mg/dL (ref <=0.2)
BILIRUBIN INDIRECT: 0.3 mg/dL (ref 0.2–1.2)
TOTAL PROTEIN: 6.3 g/dL (ref 6.1–8.1)
Total Bilirubin: 0.4 mg/dL (ref 0.2–1.2)

## 2015-12-11 LAB — LIPID PANEL
CHOLESTEROL: 155 mg/dL (ref 125–200)
HDL: 55 mg/dL (ref >=46)
LDL Cholesterol: 41 mg/dL (ref <130)
TRIGLYCERIDES: 293 mg/dL — AB (ref <150)
Total CHOL/HDL Ratio: 2.8 Ratio (ref <=5.0)
VLDL: 59 mg/dL — ABNORMAL HIGH (ref <30)

## 2016-05-30 ENCOUNTER — Other Ambulatory Visit: Payer: Self-pay | Admitting: Internal Medicine

## 2016-05-30 DIAGNOSIS — Z1231 Encounter for screening mammogram for malignant neoplasm of breast: Secondary | ICD-10-CM

## 2016-06-05 ENCOUNTER — Ambulatory Visit
Admission: RE | Admit: 2016-06-05 | Discharge: 2016-06-05 | Disposition: A | Discharge status: Home / Self Care | Payer: Medicare Other | Source: Ambulatory Visit | Attending: Internal Medicine | Admitting: Internal Medicine

## 2016-06-05 DIAGNOSIS — Z1231 Encounter for screening mammogram for malignant neoplasm of breast: Secondary | ICD-10-CM

## 2016-07-03 ENCOUNTER — Ambulatory Visit: Payer: Medicare Other | Admitting: Interventional Cardiology

## 2016-08-01 NOTE — Progress Notes (Signed)
Patient ID: [name], female   DOB: [birth year], 70 y.o.   MRN: [id number]     Cardiology Office Note   Date:  08/02/2016   ID:  Monique, Hoover Sep 12, 1945, MRN [id number]  PCP:  Irven Shelling, MD    No chief complaint on file. f/u CAD   Wt Readings from Last 3 Encounters:  08/02/16 153 lb 6.4 oz (69.6 kg)  06/15/15 153 lb (69.4 kg)  05/17/14 151 lb 9.6 oz (68.8 kg)       History of Present Illness: [name] is a 70 y.o. female  who has had multivessel stents placed, mostly in 2009. She had SHOB prior to her stents. She did have chest pain prior to the the first stent being placed. She woke up with chest pain on one occasion before the stents, and the other was [facility] with exertion back in 2003.    Mild fatigue. She used to walk for exercise 3 x/week for about one mile- decreased since her husband's illness has gotten worse.   Mild DOE with walking hills persists- no change. Occasional chest pain, not related to walking.  Feels like her fibromyalgia.  Feels like a cramp in the left shoulder.  Not similar to what she had before stents.  Much less exercise recently since her husband has been sick with cancer and getting chemotherapy.  Cancer has returned and is spreading to his lungs and bones.   CAD/ASCVD:  Exercise, Not limited by exertional leg pain. c/o Dyspnea on exertion up hill,, no change.  Denies : Chest pain. Diaphoresis. Dizziness. Leg edema. Orthopnea. Paroxysmal nocturnal dyspnea.  Syncope.  No bleeding problems on DAPT.  Used NTG a few months ago, she had felt stressed, and there was a pressure.  The NTG helped.  No sx like that since that time.     Past Medical History:  Diagnosis Date  . ASCVD (arteriosclerotic cardiovascular disease)    multivessel, s/p DE stent, LAD proximal and mid, large proximal ramus 12/23/2007---s/p DE stent implant, mid and proximal RCA 01/26/08  . Coronary atherosclerosis of native coronary artery   .  Depression   . Dyslipidemia   . Essential hypertension, benign   . Fibromyalgia syndrome   . Fracture of right fibula    11/12  . GERD (gastroesophageal reflux disease)   . H/O cardiovascular stress test    abnormal  . Tobacco use disorder     No past surgical history on file.   Current Outpatient Prescriptions  Medication Sig Dispense Refill  . aspirin 81 MG tablet Take 1 tablet (81 mg total) by mouth daily.    Marland Kitchen atorvastatin (LIPITOR) 80 MG tablet Take 1 tablet (80 mg total) by mouth daily. 30 tablet 1  . cholecalciferol (VITAMIN D) 1000 UNITS tablet Take 1,000 Units by mouth daily.    . clopidogrel (PLAVIX) 75 MG tablet Take 75 mg by mouth daily with breakfast.    . estropipate (OGEN) 3 MG tablet Take 3 mg by mouth daily.    . fish oil-omega-3 fatty acids 1000 MG capsule Take 2 g by mouth 2 (two) times daily.    Marland Kitchen FLUoxetine (PROZAC) 20 MG capsule Take 20 mg by mouth daily.    Marland Kitchen HYDROcodone-acetaminophen (NORCO/VICODIN) 5-325 MG per tablet Take 1 tablet by mouth every 6 (six) hours as needed for moderate pain.    Marland Kitchen lisinopril (PRINIVIL,ZESTRIL) 20 MG tablet Take 1 tablet (20 mg total) by mouth daily. 90 tablet 0  .  LORazepam (ATIVAN) 1 MG tablet Take 1 mg by mouth daily. Take 0.5 in the morning and 0.5 in the evening.    . nitroGLYCERIN (NITROSTAT) 0.4 MG SL tablet Place 0.4 mg under the tongue every 5 (five) minutes as needed for chest pain. X 3 doses     No current facility-administered medications for this visit.     Allergies:   Morphine and related    Social History:  The patient  reports that she has been smoking.  She does not have any smokeless tobacco history on file. She reports that she drinks alcohol. She reports that she does not use drugs.   Family History:  The patient's family history includes CAD in her father; CVA in her brother and father; Heart attack in her sister; Heart failure in her father; Hypertension in her brother; Ovarian cancer in her mother.     ROS:  Please see the history of present illness.   Otherwise, review of systems are positive for DOE.   All other systems are reviewed and negative.    PHYSICAL EXAM: VS:  BP 116/68   Pulse 76   Ht 5\' 2"  (1.575 m)   Wt 153 lb 6.4 oz (69.6 kg)   BMI 28.06 kg/m  , BMI Body mass index is 28.06 kg/m. GEN: Well nourished, well developed, in no acute distress  HEENT: normal  Neck: no JVD, carotid bruits, or masses Cardiac: RRR; no murmurs, rubs, or gallops,no edema  Respiratory:  clear to auscultation bilaterally, normal work of breathing GI: soft, nontender, nondistended, + BS MS: no deformity or atrophy  Skin: warm and dry, no rash Neuro:  Strength and sensation are intact Psych: euthymic mood, full affect   EKG:   The ekg ordered today demonstrates NSR, no ST segment changes   Recent Labs: 12/11/2015: ALT 14   Lipid Panel    Component Value Date/Time   CHOL 155 12/11/2015 0840   CHOL 142 05/31/2014 0853   TRIG 293 (H) 12/11/2015 0840   TRIG 256 (H) 05/31/2014 0853   HDL 55 12/11/2015 0840   HDL 59 05/31/2014 0853   CHOLHDL 2.8 12/11/2015 0840   VLDL 59 (H) 12/11/2015 0840   LDLCALC 41 12/11/2015 0840   LDLCALC 32 05/31/2014 0853     Other studies Reviewed: Additional studies/ records that were reviewed today with results demonstrating: Normal nuclear stress test in 5/14.   ASSESSMENT AND PLAN:  1. CAD:  Continue aspirin and Plavix.  No bleeding issues.  No angina. Continue aggressive secondary prevention. Negative cardiolite in 5/14. Stress test result reviewed personally.  If she starts noticing exertional symptoms, she will let us know.  Shoulder pain seems atypical. 2. Mixed hyperlipidemia:  Check labs in May 2018.  LDL Controlled in the past.  TG elevated at last check.  Decrease sugar and deep fried food intake.  3.  Tobacco abuse: Increased smoking since husband's illness.  We discussed patches.  She would like to hold off for now. The patient was  counseled on the dangers of tobacco use, both inhaled and oral, which include, but are not limited to cardiovascular disease, increased cancer risk of multiple types of cancer, COPD, peripheral vascular disease, strokes. She was also counseled on the benefits of smoking cessation. The patient was firmly advised to quit.    We also reviewed strategies to maximize success, including: Removing cigarettes and smoking materials from environment Stress management Substitution of other forms of reinforcement Support of family/friends. Selecting a quit date.  Patient provided contact information for 1-800-QUIT-NOW     Current medicines are reviewed at length with the patient today.  The patient concerns regarding her medicines were addressed.  The following changes have been made:  No change  Labs/ tests ordered today include:  No orders of the defined types were placed in this encounter.   Recommend 150 minutes/week of aerobic exercise Low fat, low carb, high fiber diet recommended  Disposition:   FU in 1 year   Signed, Larae Grooms, MD  08/02/2016 10:28 AM    [facility] [place], [place], [place]  [address] Phone: [phone]; Fax: [phone]

## 2016-08-02 ENCOUNTER — Encounter (INDEPENDENT_AMBULATORY_CARE_PROVIDER_SITE_OTHER): Payer: Self-pay

## 2016-08-02 ENCOUNTER — Encounter: Payer: Self-pay | Admitting: Interventional Cardiology

## 2016-08-02 ENCOUNTER — Ambulatory Visit (INDEPENDENT_AMBULATORY_CARE_PROVIDER_SITE_OTHER): Payer: Medicare Other | Admitting: Interventional Cardiology

## 2016-08-02 VITALS — BP 116/68 | HR 76 | Ht 62.0 in | Wt 153.4 lb

## 2016-08-02 DIAGNOSIS — Z720 Tobacco use: Secondary | ICD-10-CM | POA: Diagnosis not present

## 2016-08-02 DIAGNOSIS — I2510 Atherosclerotic heart disease of native coronary artery without angina pectoris: Secondary | ICD-10-CM | POA: Diagnosis not present

## 2016-08-02 DIAGNOSIS — E782 Mixed hyperlipidemia: Secondary | ICD-10-CM

## 2016-08-02 DIAGNOSIS — Z79899 Other long term (current) drug therapy: Secondary | ICD-10-CM

## 2016-08-02 MED ORDER — NITROGLYCERIN 0.4 MG SL SUBL: 0.4000 mg | SUBLINGUAL_TABLET | SUBLINGUAL | 11 refills | Status: DC | PRN

## 2016-08-02 NOTE — Patient Instructions (Signed)
Medication Instructions:  The current medical regimen is effective;  continue present plan and medications.  Labwork: Please have blood work in May 2018 (fasting for lipid/CMP)  Follow-Up: Follow up in 1 year with Dr. Irish Lack.  You will receive a letter in the mail 2 months before you are due.  Please call us when you receive this letter to schedule your follow up appointment.  If you need a refill on your cardiac medications before your next appointment, please call your pharmacy.  1-800-QUIT-NOW or [phone]  Thank you for choosing [facility]!!

## 2016-10-15 ENCOUNTER — Telehealth: Payer: Self-pay | Admitting: Interventional Cardiology

## 2016-10-15 NOTE — Telephone Encounter (Signed)
Patient states that an hour ago she had 8/10 right sided chest pain that radiated into her back between her shoulder blades and nausea at rest that lasted for 5-10 minutes. She denies any SOB, lightheadedness or dizziness. Patient is unable to check BP at home. She states that she took 1 NTG and a tums and the chest pain was relieved. She states that she is still having slight pain between her shoulder blades but denies any other symptoms. She is requesting to be seen in the office today. Patient notified that there are no appointments today and that the office is closing soon. An appointment was offered for tomorrow morning and the patient states that she can not come tomorrow that she has to take her husband to the doctor tomorrow.  Patient was advised to be seen in the ED if her symptoms worsen (education provided). Patient verbalized understanding. Patient was offered an appointment on a different day. Patient refuses at this time.

## 2016-10-15 NOTE — Telephone Encounter (Signed)
New Message    Pt have pain in back, and wants  To know if she should come in office , she is nauseated but no other symptoms

## 2016-10-15 NOTE — Telephone Encounter (Signed)
Would start imdur 30 mg daily. We should consider reepeat cath given her history, particularly if sx repeat themselves.

## 2016-10-16 MED ORDER — ISOSORBIDE MONONITRATE ER 30 MG PO TB24: 30.0000 mg | ORAL_TABLET | Freq: Every day | ORAL | 3 refills | Status: DC

## 2016-10-16 NOTE — Telephone Encounter (Signed)
Called and spoke with patient on her cell phone. She states that she is feeling better today and that she has not had any other episodes or chest pain since yesterday afternoon. She states that she is still having slight discomfort between her shoulder blades but that it could be related to her fibromyalgia. Isosorbide mononitrate (imdur) 30 mg daily was ordered per Dr. Hassell Done recommendation and sent to patient's preferred pharmacy. Patient instructed to let us know if her symptoms worsen or continue. Patient verbalized understanding.

## 2016-10-16 NOTE — Telephone Encounter (Signed)
Left message for patient to call back  

## 2016-12-20 ENCOUNTER — Other Ambulatory Visit: Payer: Medicare Other

## 2016-12-23 ENCOUNTER — Other Ambulatory Visit: Payer: Medicare Other | Admitting: *Deleted

## 2016-12-23 DIAGNOSIS — E782 Mixed hyperlipidemia: Secondary | ICD-10-CM

## 2016-12-23 DIAGNOSIS — I2510 Atherosclerotic heart disease of native coronary artery without angina pectoris: Secondary | ICD-10-CM

## 2016-12-23 LAB — COMPREHENSIVE METABOLIC PANEL
ALBUMIN: 3.9 g/dL (ref 3.5–4.8)
ALK PHOS: 75 IU/L (ref 39–117)
ALT: 13 IU/L (ref 0–32)
AST: 12 IU/L (ref 0–40)
Albumin/Globulin Ratio: 1.8 (ref 1.2–2.2)
BILIRUBIN TOTAL: 0.4 mg/dL (ref 0.0–1.2)
BUN / CREAT RATIO: 24 (ref 12–28)
BUN: 17 mg/dL (ref 8–27)
CHLORIDE: 103 mmol/L (ref 96–106)
CO2: 22 mmol/L (ref 18–29)
Calcium: 8.7 mg/dL (ref 8.7–10.3)
Creatinine, Ser: 0.71 mg/dL (ref 0.57–1.00)
GFR calc Af Amer: 100 mL/min/{1.73_m2} (ref >59)
GFR calc non Af Amer: 87 mL/min/{1.73_m2} (ref >59)
GLUCOSE: 101 mg/dL — AB (ref 65–99)
Globulin, Total: 2.2 g/dL (ref 1.5–4.5)
Potassium: 4.7 mmol/L (ref 3.5–5.2)
SODIUM: 140 mmol/L (ref 134–144)
Total Protein: 6.1 g/dL (ref 6.0–8.5)

## 2016-12-23 LAB — LIPID PANEL
CHOLESTEROL TOTAL: 153 mg/dL (ref 100–199)
Chol/HDL Ratio: 2.4 ratio (ref 0.0–4.4)
HDL: 64 mg/dL (ref >39)
LDL Calculated: 42 mg/dL (ref 0–99)
Triglycerides: 234 mg/dL — ABNORMAL HIGH (ref 0–149)
VLDL Cholesterol Cal: 47 mg/dL — ABNORMAL HIGH (ref 5–40)

## 2017-06-04 ENCOUNTER — Other Ambulatory Visit: Payer: Self-pay | Admitting: Internal Medicine

## 2017-06-04 DIAGNOSIS — Z139 Encounter for screening, unspecified: Secondary | ICD-10-CM

## 2017-07-02 ENCOUNTER — Ambulatory Visit: Payer: Medicare Other

## 2017-08-15 ENCOUNTER — Ambulatory Visit
Admission: RE | Admit: 2017-08-15 | Discharge: 2017-08-15 | Disposition: A | Discharge status: Home / Self Care | Payer: Medicare Other | Source: Ambulatory Visit | Attending: Internal Medicine | Admitting: Internal Medicine

## 2017-08-15 DIAGNOSIS — Z139 Encounter for screening, unspecified: Secondary | ICD-10-CM

## 2017-10-19 ENCOUNTER — Other Ambulatory Visit: Payer: Self-pay | Admitting: Interventional Cardiology

## 2017-10-24 ENCOUNTER — Other Ambulatory Visit: Payer: Self-pay | Admitting: Interventional Cardiology

## 2017-11-24 ENCOUNTER — Other Ambulatory Visit: Payer: Self-pay | Admitting: Interventional Cardiology

## 2017-11-28 ENCOUNTER — Other Ambulatory Visit: Payer: Self-pay | Admitting: Interventional Cardiology

## 2017-11-28 MED ORDER — ISOSORBIDE MONONITRATE ER 30 MG PO TB24: 30.0000 mg | ORAL_TABLET | Freq: Every day | ORAL | 1 refills | Status: DC

## 2017-11-28 NOTE — Telephone Encounter (Signed)
Pt's medication was sent to pt's pharmacy as requested. Confirmation received.  °

## 2017-11-28 NOTE — Telephone Encounter (Signed)
New message     *STAT* If patient is at the pharmacy, call can be transferred to refill team.   1. Which medications need to be refilled? (please list name of each medication and dose if known) isosorbide mononitrate (IMDUR) 30 MG 24 hr tablet  2. Which pharmacy/location (including street and city if local pharmacy) is medication to be sent to?[id number] - [place], [place] - [address]  3. Do they need a 30 day or 90 day supply? [place]

## 2017-12-05 ENCOUNTER — Other Ambulatory Visit: Payer: Self-pay | Admitting: Interventional Cardiology

## 2017-12-05 NOTE — Telephone Encounter (Signed)
Outpatient Medication Detail    Disp Refills Start End   isosorbide mononitrate (IMDUR) 30 MG 24 hr tablet 30 tablet 1 11/28/2017    Sig - Route: Take 1 tablet (30 mg total) by mouth daily. Please keep upcoming appt before anymore refills. Final Attempt - Oral   Sent to pharmacy as: isosorbide mononitrate (IMDUR) 30 MG 24 hr tablet   Notes to Pharmacy: Pt must keep upcoming appt before anymore refills. Pt has prescribed enough medication to last until office visit in June. LOV 07/2016   E-Prescribing Status: Receipt confirmed by pharmacy (11/28/2017 9:40 AM EDT)   Pharmacy   [id number] - [place], [place] - [address]

## 2017-12-30 ENCOUNTER — Other Ambulatory Visit: Payer: Self-pay | Admitting: Interventional Cardiology

## 2018-01-19 NOTE — Progress Notes (Signed)
Cardiology Office Note   Date:  01/20/2018   ID:  Monique, Hoover [birth year], MRN [id number]  PCP:  Lavone Orn, MD    No chief complaint on file.  CAD  Wt Readings from Last 3 Encounters:  01/20/18 158 lb (71.7 kg)  08/02/16 153 lb 6.4 oz (69.6 kg)  06/15/15 153 lb (69.4 kg)       History of Present Illness: [name] is a 72 y.o. female  who has had multivessel stents placed, in 2003 and 2009. She had a proximal LAD stent placed in 2003, 3.0 x 18 [place].  She had SHOB prior to her stents and an abnormal ETT in 2003. She did have chest pain prior to the the first stent being placed. She woke up with chest pain on one occasion before the stents, and the other was [facility] with exertion back in 2003.   She had stents to the RCA and ramus in 2009.    Husband was ill with cancer in 2018.  He is still getting treatment for metastatic renal cancer.  No bleeding problems.   Denies :  Dizziness. Leg edema. Nitroglycerin use. Orthopnea. Palpitations. Paroxysmal nocturnal dyspnea. Syncope.   Occasional chest pressure and DOE when walking up hills.  Not all the time, but she tries to keep going.  She walks for exercise three days/week.  Past Medical History:  Diagnosis Date  . ASCVD (arteriosclerotic cardiovascular disease)    multivessel, s/p DE stent, LAD proximal and mid, large proximal ramus 12/23/2007---s/p DE stent implant, mid and proximal RCA 01/26/08  . Coronary atherosclerosis of native coronary artery   . Depression   . Dyslipidemia   . Essential hypertension, benign   . Fibromyalgia syndrome   . Fracture of right fibula    11/12  . GERD (gastroesophageal reflux disease)   . H/O cardiovascular stress test    abnormal  . Tobacco use disorder     History reviewed. No pertinent surgical history.   Current Outpatient Medications  Medication Sig Dispense Refill  . aspirin 81 MG tablet Take 1 tablet (81 mg total) by mouth daily.    Marland Kitchen atorvastatin  (LIPITOR) 80 MG tablet Take 1 tablet (80 mg total) by mouth daily. 30 tablet 1  . cholecalciferol (VITAMIN D) 1000 UNITS tablet Take 1,000 Units by mouth daily.    . clopidogrel (PLAVIX) 75 MG tablet Take 75 mg by mouth daily with breakfast.    . estropipate (OGEN) 3 MG tablet Take 3 mg by mouth daily.    . fish oil-omega-3 fatty acids 1000 MG capsule Take 2 g by mouth 2 (two) times daily.    Marland Kitchen FLUoxetine (PROZAC) 20 MG capsule Take 20 mg by mouth daily.    Marland Kitchen HYDROcodone-acetaminophen (NORCO/VICODIN) 5-325 MG per tablet Take 1 tablet by mouth every 6 (six) hours as needed for moderate pain.    . isosorbide mononitrate (IMDUR) 30 MG 24 hr tablet Take 1 tablet (30 mg total) by mouth daily. Please keep upcoming appt before anymore refills. Final Attempt 30 tablet 1  . lisinopril (PRINIVIL,ZESTRIL) 20 MG tablet Take 1 tablet (20 mg total) by mouth daily. 90 tablet 0  . LORazepam (ATIVAN) 1 MG tablet Take 1 mg by mouth daily. Take 0.5 in the morning and 0.5 in the evening.    . nitroGLYCERIN (NITROSTAT) 0.4 MG SL tablet Place 1 tablet (0.4 mg total) under the tongue every 5 (five) minutes as needed for chest pain. X 3 doses  25 tablet 11   No current facility-administered medications for this visit.     Allergies:   Morphine and related    Social History:  The patient  reports that she has been smoking.  She has never used smokeless tobacco. She reports that she drinks alcohol. She reports that she does not use drugs.   Family History:  The patient's family history includes CAD in her father; CVA in her brother and father; Heart attack in her sister; Heart failure in her father; Hypertension in her brother; Ovarian cancer in her mother.    ROS:  Please see the history of present illness.   Otherwise, review of systems are positive for CP, DOE.   All other systems are reviewed and negative.    PHYSICAL EXAM: VS:  BP 112/70   Pulse 66   Ht 5\' 2"  (1.575 m)   Wt 158 lb (71.7 kg)   SpO2 96%    BMI 28.90 kg/m  , BMI Body mass index is 28.9 kg/m. GEN: Well nourished, well developed, in no acute distress  HEENT: normal  Neck: no JVD, carotid bruits, or masses Cardiac: RRR; no murmurs, rubs, or gallops,no edema  Respiratory:  clear to auscultation bilaterally, normal work of breathing GI: soft, nontender, nondistended, + BS MS: no deformity or atrophy  Skin: warm and dry, no rash Neuro:  Strength and sensation are intact Psych: euthymic mood, full affect   EKG:   The ekg ordered today demonstrates NSR, inferior Q waves- unchanged, no ST changes   Recent Labs: No results found for requested labs within last 8760 hours.   Lipid Panel    Component Value Date/Time   CHOL 153 12/23/2016 0815   CHOL 142 05/31/2014 0853   TRIG 234 (H) 12/23/2016 0815   TRIG 256 (H) 05/31/2014 0853   HDL 64 12/23/2016 0815   HDL 59 05/31/2014 0853   CHOLHDL 2.4 12/23/2016 0815   CHOLHDL 2.8 12/11/2015 0840   VLDL 59 (H) 12/11/2015 0840   LDLCALC 42 12/23/2016 0815   LDLCALC 32 05/31/2014 0853     Other studies Reviewed: Additional studies/ records that were reviewed today with results demonstrating: cath reports and 2014 stress test reviewed.   ASSESSMENT AND PLAN:  1. CAD: Some symptoms concerning for angina.  We will plan for exercise nuclear stress test to evaluate for ischemia.  It is been 5 years since her last stress test in 10 years since her last cardiac cath.  Continue aggressive secondary prevention. 2. Mixed hyperlipidemia: LDL 42.  Well-controlled.  Continue current medications. 3. Tobacco abuse: Still smoking; trying to quit- she has cut back drastically.   Current medicines are reviewed at length with the patient today.  The patient concerns regarding her medicines were addressed.  The following changes have been made:  No change  Labs/ tests ordered today include:  No orders of the defined types were placed in this encounter.   Recommend 150 minutes/week of  aerobic exercise Low fat, low carb, high fiber diet recommended  Disposition:   FU for stress test   Signed, Larae Grooms, MD  01/20/2018 11:17 AM    [facility], [place], [place]  [address] Phone: [phone]; Fax: [phone]

## 2018-01-20 ENCOUNTER — Ambulatory Visit (INDEPENDENT_AMBULATORY_CARE_PROVIDER_SITE_OTHER): Payer: Medicare Other | Admitting: Interventional Cardiology

## 2018-01-20 ENCOUNTER — Encounter: Payer: Self-pay | Admitting: Interventional Cardiology

## 2018-01-20 VITALS — BP 112/70 | HR 66 | Ht 62.0 in | Wt 158.0 lb

## 2018-01-20 DIAGNOSIS — Z720 Tobacco use: Secondary | ICD-10-CM | POA: Diagnosis not present

## 2018-01-20 DIAGNOSIS — E782 Mixed hyperlipidemia: Secondary | ICD-10-CM

## 2018-01-20 DIAGNOSIS — I25118 Atherosclerotic heart disease of native coronary artery with other forms of angina pectoris: Secondary | ICD-10-CM | POA: Diagnosis not present

## 2018-01-20 LAB — COMPREHENSIVE METABOLIC PANEL
ALBUMIN: 4.3 g/dL (ref 3.5–4.8)
ALK PHOS: 88 IU/L (ref 39–117)
ALT: 22 IU/L (ref 0–32)
AST: 15 IU/L (ref 0–40)
Albumin/Globulin Ratio: 1.7 (ref 1.2–2.2)
BILIRUBIN TOTAL: 0.4 mg/dL (ref 0.0–1.2)
BUN / CREAT RATIO: 20 (ref 12–28)
BUN: 15 mg/dL (ref 8–27)
CHLORIDE: 100 mmol/L (ref 96–106)
CO2: 24 mmol/L (ref 20–29)
Calcium: 9.7 mg/dL (ref 8.7–10.3)
Creatinine, Ser: 0.74 mg/dL (ref 0.57–1.00)
GFR calc Af Amer: 94 mL/min/{1.73_m2} (ref >59)
GFR calc non Af Amer: 82 mL/min/{1.73_m2} (ref >59)
GLOBULIN, TOTAL: 2.6 g/dL (ref 1.5–4.5)
Glucose: 89 mg/dL (ref 65–99)
POTASSIUM: 5.6 mmol/L — AB (ref 3.5–5.2)
SODIUM: 139 mmol/L (ref 134–144)
Total Protein: 6.9 g/dL (ref 6.0–8.5)

## 2018-01-20 LAB — LIPID PANEL
CHOLESTEROL TOTAL: 159 mg/dL (ref 100–199)
Chol/HDL Ratio: 2.7 ratio (ref 0.0–4.4)
HDL: 58 mg/dL (ref >39)
LDL CALC: 53 mg/dL (ref 0–99)
Triglycerides: 241 mg/dL — ABNORMAL HIGH (ref 0–149)
VLDL Cholesterol Cal: 48 mg/dL — ABNORMAL HIGH (ref 5–40)

## 2018-01-20 NOTE — Patient Instructions (Signed)
Medication Instructions:  Your physician recommends that you continue on your current medications as directed. Please refer to the Current Medication list given to you today.   Labwork: TODAY: CMET, LIPIDS  Testing/Procedures: Your physician has requested that you have en exercise stress myoview. For further information please visit [email or web address]. Please follow instruction sheet, as given.  Follow-Up: Based test results  Any Other Special Instructions Will Be Listed Below (If Applicable).     If you need a refill on your cardiac medications before your next appointment, please call your pharmacy.

## 2018-01-22 ENCOUNTER — Telehealth (HOSPITAL_COMMUNITY): Payer: Self-pay | Admitting: *Deleted

## 2018-01-22 NOTE — Telephone Encounter (Signed)
Left message on voicemail per DPR in reference to upcoming appointment scheduled on 01/26/18 with detailed instructions given per Myocardial Perfusion Study Information Sheet for the test. LM to arrive 15 minutes early, and that it is imperative to arrive on time for appointment to keep from having the test rescheduled. If you need to cancel or reschedule your appointment, please call the office within 24 hours of your appointment. Failure to do so may result in a cancellation of your appointment, and a $50 no show fee. Phone number given for call back for any questions. Kirstie Peri

## 2018-01-27 ENCOUNTER — Ambulatory Visit (HOSPITAL_COMMUNITY): Payer: Medicare Other | Attending: Cardiology

## 2018-01-27 ENCOUNTER — Other Ambulatory Visit: Payer: Self-pay

## 2018-01-27 DIAGNOSIS — I25118 Atherosclerotic heart disease of native coronary artery with other forms of angina pectoris: Secondary | ICD-10-CM | POA: Diagnosis not present

## 2018-01-27 DIAGNOSIS — E875 Hyperkalemia: Secondary | ICD-10-CM

## 2018-01-27 LAB — MYOCARDIAL PERFUSION IMAGING
CHL CUP NUCLEAR SSS: 8
CSEPEDS: 30 s
CSEPEW: 7 METS
CSEPHR: 93 %
Exercise duration (min): 5 min
LV dias vol: 46 mL (ref 46–106)
LV sys vol: 7 mL
MPHR: 149 {beats}/min
NUC STRESS TID: 1.16
Peak HR: 139 {beats}/min
RATE: 0.42
Rest HR: 68 {beats}/min
SDS: 0
SRS: 8

## 2018-01-27 MED ORDER — TECHNETIUM TC 99M TETROFOSMIN IV KIT
10.6000 | PACK | Freq: Once | INTRAVENOUS | Status: AC | PRN
  Administered 2018-01-27: 10.6 via INTRAVENOUS
  Filled 2018-01-27: qty 11

## 2018-01-27 MED ORDER — TECHNETIUM TC 99M TETROFOSMIN IV KIT
31.5000 | PACK | Freq: Once | INTRAVENOUS | Status: AC | PRN
  Administered 2018-01-27: 31.5 via INTRAVENOUS
  Filled 2018-01-27: qty 32

## 2018-01-29 ENCOUNTER — Telehealth: Payer: Self-pay | Admitting: Interventional Cardiology

## 2018-01-29 NOTE — Telephone Encounter (Signed)
Patient made aware of results. Instructed patient to let us know if her Sx return. Patient verbalizes understanding.

## 2018-01-29 NOTE — Telephone Encounter (Signed)
New message   Patient calling for stress test results

## 2018-01-29 NOTE — Telephone Encounter (Signed)
-----   Message from Jettie Booze, MD sent at 01/29/2018  2:35 AM EDT ----- Low risk study. Let us know if there are further sx.

## 2018-01-31 ENCOUNTER — Other Ambulatory Visit: Payer: Self-pay | Admitting: Interventional Cardiology

## 2018-02-04 ENCOUNTER — Other Ambulatory Visit: Payer: Medicare Other | Admitting: *Deleted

## 2018-02-04 DIAGNOSIS — E875 Hyperkalemia: Secondary | ICD-10-CM

## 2018-02-04 LAB — BASIC METABOLIC PANEL
BUN/Creatinine Ratio: 15 (ref 12–28)
BUN: 11 mg/dL (ref 8–27)
CALCIUM: 9.3 mg/dL (ref 8.7–10.3)
CHLORIDE: 102 mmol/L (ref 96–106)
CO2: 22 mmol/L (ref 20–29)
Creatinine, Ser: 0.74 mg/dL (ref 0.57–1.00)
GFR calc Af Amer: 94 mL/min/{1.73_m2} (ref >59)
GFR, EST NON AFRICAN AMERICAN: 82 mL/min/{1.73_m2} (ref >59)
GLUCOSE: 111 mg/dL — AB (ref 65–99)
Potassium: 4.9 mmol/L (ref 3.5–5.2)
SODIUM: 137 mmol/L (ref 134–144)

## 2018-02-09 ENCOUNTER — Telehealth: Payer: Self-pay | Admitting: Interventional Cardiology

## 2018-02-09 NOTE — Telephone Encounter (Signed)
New Message: ° ° ° ° ° °Pt is returning a call for lab results. °

## 2018-02-09 NOTE — Telephone Encounter (Signed)
-----   Message from Jettie Booze, MD sent at 02/06/2018 10:19 AM EDT ----- Normal electrolytes

## 2018-02-09 NOTE — Telephone Encounter (Signed)
Left detailed message of results on VM (DPR on file). Instructed for patient to call back with any questions. 

## 2018-02-10 ENCOUNTER — Telehealth: Payer: Self-pay | Admitting: Interventional Cardiology

## 2018-02-10 NOTE — Telephone Encounter (Signed)
New Message   Pt returning call about labs, she states she did not get the message. Please call

## 2018-02-10 NOTE — Telephone Encounter (Signed)
Left detailed message of results on VM (DPR on file). Instructed for patient to call back with any questions. 

## 2018-02-10 NOTE — Telephone Encounter (Signed)
-----   Message from Jettie Booze, MD sent at 02/06/2018 10:19 AM EDT ----- Normal electrolytes

## 2018-03-25 MED ORDER — LIDOCAINE-EPINEPHRINE 1 %-1:100000 IJ SOLN
INTRAMUSCULAR | Status: AC
  Filled 2018-03-25: qty 1

## 2018-05-15 ENCOUNTER — Other Ambulatory Visit: Payer: Self-pay | Admitting: Internal Medicine

## 2018-05-15 DIAGNOSIS — M8580 Other specified disorders of bone density and structure, unspecified site: Secondary | ICD-10-CM

## 2018-07-15 ENCOUNTER — Other Ambulatory Visit: Payer: Self-pay | Admitting: Internal Medicine

## 2018-07-15 DIAGNOSIS — M8580 Other specified disorders of bone density and structure, unspecified site: Secondary | ICD-10-CM

## 2018-07-15 DIAGNOSIS — Z1231 Encounter for screening mammogram for malignant neoplasm of breast: Secondary | ICD-10-CM

## 2018-07-16 ENCOUNTER — Telehealth: Payer: Self-pay

## 2018-07-16 NOTE — Telephone Encounter (Signed)
   [facility] Pre-operative Risk Assessment    Request for surgical clearance:  1. What type of surgery is being performed? Five teeth extractions   2. When is this surgery scheduled?  TBD   3. What type of clearance is required (medical clearance vs. Pharmacy clearance to hold med vs. Both)? BOTH  4. Are there any medications that need to be held prior to surgery and how long? Plavix and ASA   5. Practice name and name of physician performing surgery?  [facility], Perlie Gold, DDS   6. What is your office phone number [phone]    7.   What is your office fax number  [phone]  8.   Anesthesia type (None, local, MAC, general) ? Unknown   [name] 07/16/2018, 11:19 AM  __________________________________

## 2018-07-17 ENCOUNTER — Other Ambulatory Visit: Payer: Medicare Other

## 2018-07-17 NOTE — Telephone Encounter (Signed)
Dr Irish Lack, there is request to hold plavix and ASA for extraction of 5 teeth. She had low risk stress test in 01/2018 for "some symptoms concerning for angina". I will call pt to see if having any concerning sx. If not can DAPT be held?  Please route response back to P CV DIV PREOP  Thanks, Gae Bon

## 2018-07-21 NOTE — Telephone Encounter (Signed)
Called pt to check on current status. Left VM on home phone.

## 2018-07-21 NOTE — Telephone Encounter (Signed)
I thnk if she is symptom free, ok to hold antiplatelet therapy for tooth extraction. [name]

## 2018-07-23 NOTE — Telephone Encounter (Signed)
Follow up:   Patient returning call back concenring her clearance. Please call back.

## 2018-07-23 NOTE — Telephone Encounter (Signed)
Follow up   Pt calling back again to check on her surgical clearance for a tooth extraction. Please call

## 2018-07-24 NOTE — Telephone Encounter (Signed)
   Primary Cardiologist: Larae Grooms, MD  Chart reviewed as part of pre-operative protocol coverage. Patient was contacted 07/24/2018 in reference to pre-operative risk assessment for pending surgery as outlined below.  ELVY MCLARTY was last seen on 01/20/18 by Dr. Irish Lack.  Since that day, CHANTALE LEUGERS has done well, no chest pain or SOB and meets 4 METS with activity. .  Therefore, based on ACC/AHA guidelines, the patient would be at acceptable risk for the planned procedure without further cardiovascular testing.  She may hold the asprin and plavix for up to 5 days for procedure and resume afterwards as soon as possible.    I will route this recommendation to the requesting party via Epic fax function and remove from pre-op pool.  Please call with questions.  Cecilie Kicks, NP 07/24/2018, 2:00 PM

## 2018-09-11 ENCOUNTER — Ambulatory Visit
Admission: RE | Admit: 2018-09-11 | Discharge: 2018-09-11 | Disposition: A | Discharge status: Home / Self Care | Payer: Medicare Other | Source: Ambulatory Visit | Attending: Internal Medicine | Admitting: Internal Medicine

## 2018-09-11 DIAGNOSIS — M8580 Other specified disorders of bone density and structure, unspecified site: Secondary | ICD-10-CM

## 2018-09-11 DIAGNOSIS — Z1231 Encounter for screening mammogram for malignant neoplasm of breast: Secondary | ICD-10-CM

## 2019-03-27 ENCOUNTER — Other Ambulatory Visit: Payer: Self-pay | Admitting: Interventional Cardiology

## 2019-05-02 ENCOUNTER — Other Ambulatory Visit: Payer: Self-pay | Admitting: Interventional Cardiology

## 2019-05-28 ENCOUNTER — Other Ambulatory Visit: Payer: Self-pay | Admitting: Interventional Cardiology

## 2019-07-21 ENCOUNTER — Other Ambulatory Visit (HOSPITAL_COMMUNITY): Payer: Self-pay | Admitting: Gastroenterology

## 2019-07-21 ENCOUNTER — Other Ambulatory Visit: Payer: Self-pay | Admitting: Gastroenterology

## 2019-07-21 DIAGNOSIS — R1032 Left lower quadrant pain: Secondary | ICD-10-CM

## 2019-07-23 ENCOUNTER — Other Ambulatory Visit: Payer: Self-pay

## 2019-07-23 ENCOUNTER — Ambulatory Visit (HOSPITAL_COMMUNITY)
Admission: RE | Admit: 2019-07-23 | Discharge: 2019-07-23 | Disposition: A | Discharge status: Home / Self Care | Payer: Medicare Other | Source: Ambulatory Visit | Attending: Gastroenterology | Admitting: Gastroenterology

## 2019-07-23 DIAGNOSIS — R1032 Left lower quadrant pain: Secondary | ICD-10-CM | POA: Diagnosis not present

## 2019-07-23 MED ORDER — IOHEXOL 300 MG/ML  SOLN
100.0000 mL | Freq: Once | INTRAMUSCULAR | Status: AC | PRN
  Administered 2019-07-23: 100 mL via INTRAVENOUS

## 2019-08-02 ENCOUNTER — Telehealth: Payer: Self-pay | Admitting: *Deleted

## 2019-08-02 NOTE — Telephone Encounter (Signed)
   [facility] Pre-operative Risk Assessment    Request for surgical clearance:  1. What type of surgery is being performed? COLONOSCOPY   2. When is this surgery scheduled? TBD   3. What type of clearance is required (medical clearance vs. Pharmacy clearance to hold med vs. Both)? MEDICAL  4. Are there any medications that need to be held prior to surgery and how long? PLAVIX X 5 DAYS   5. Practice name and name of physician performing surgery? [facility]; DR. Cristina Gong   6. What is your office phone number [phone]    7.   What is your office fax number [phone]  8.   Anesthesia type (None, local, MAC, general) ?  NOT LISTED   [name] 08/02/2019, 12:10 PM  _________________________________________________________________   (provider comments below)

## 2019-08-03 NOTE — Telephone Encounter (Signed)
   Primary Cardiologist: Larae Grooms, MD  Chart reviewed as part of pre-operative protocol coverage. Patient was contacted 08/03/2019 in reference to pre-operative risk assessment for pending surgery as outlined below.  [name] was last seen on 01/20/2018 by Dr. Irish Lack.  She had a low risk stress test in 01/2018. Since that day, [name] has done well with no new cardiac complaints.   She may hold Plavix for 5 days and resume as soon as safe after procedure.   Therefore, based on ACC/AHA guidelines, the patient would be at acceptable risk for the planned procedure without further cardiovascular testing.   I will route this recommendation to the requesting party via Epic fax function and remove from pre-op pool.  Please call with questions.  Daune Perch, NP 08/03/2019, 9:51 AM

## 2019-08-08 ENCOUNTER — Other Ambulatory Visit: Payer: Self-pay | Admitting: Interventional Cardiology

## 2019-09-13 NOTE — Progress Notes (Signed)
Cardiology Office Note   Date:  09/15/2019   ID:  [name], Bolan April 04, 1946, MRN [id number]  PCP:  Lavone Orn, MD    No chief complaint on file.  CAD  Wt Readings from Last 3 Encounters:  09/15/19 151 lb 12.8 oz (68.9 kg)  01/27/18 158 lb (71.7 kg)  01/20/18 158 lb (71.7 kg)       History of Present Illness: [name] is a 74 y.o. female  who has had multivessel stents placed, in 2003 and 2009. She had a proximal LAD stent placed in 2003, 3.0 x 18 Cypher.  She had SHOB prior to her stents and an abnormal ETT in 2003. She did have chest pain prior to the the first stent being placed. She woke up with chest pain on one occasion before the stents, and the other was [facility] with exertion back in 2003.   She had stents to the RCA and ramus in 2009.    Husband was ill with cancer in 2018.  Due to angina, had a nuclear stress test in 2018 showing:  "Nuclear stress EF: 85%.  Blood pressure demonstrated a normal response to exercise.  There was no ST segment deviation noted during stress.  The study is normal.  The left ventricular ejection fraction is hyperdynamic (>65%).   1. EF > 60%, normal wall motion.  2. No ischemia or infarction by perfusion images.  3. Normal exercise tolerance with no ischemic ST changes on exercise ECG.   Normal study."   Since the last visit, she has had sciatica in the left leg.  She still walks 3x/wek for 25 minutes.  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   Denies : Chest pain. Dizziness. Leg edema. Nitroglycerin use. Orthopnea. Palpitations. Paroxysmal nocturnal dyspnea. Shortness of breath. Syncope.   No sx from COVID.  She has been keeping her distance.  She is on the wait list for the vaccine.   She had a colonoscopy and a polyp was removed.  She held Plvix 5 days prior. She needs teeth pulled as well.     Past Medical History:  Diagnosis Date  . ASCVD (arteriosclerotic cardiovascular disease)    multivessel, s/p DE  stent, LAD proximal and mid, large proximal ramus 12/23/2007---s/p DE stent implant, mid and proximal RCA 01/26/08  . Coronary atherosclerosis of native coronary artery   . Depression   . Dyslipidemia   . Essential hypertension, benign   . Fibromyalgia syndrome   . Fracture of right fibula    11/12  . GERD (gastroesophageal reflux disease)   . H/O cardiovascular stress test    abnormal  . Tobacco use disorder     No past surgical history on file.   Current Outpatient Medications  Medication Sig Dispense Refill  . aspirin 81 MG tablet Take 1 tablet (81 mg total) by mouth daily.    Marland Kitchen atorvastatin (LIPITOR) 80 MG tablet Take 1 tablet (80 mg total) by mouth daily. 30 tablet 1  . cholecalciferol (VITAMIN D) 1000 UNITS tablet Take 1,000 Units by mouth daily.    . clopidogrel (PLAVIX) 75 MG tablet Take 75 mg by mouth daily with breakfast.    . fish oil-omega-3 fatty acids 1000 MG capsule Take 2 g by mouth 2 (two) times daily.    Marland Kitchen FLUoxetine (PROZAC) 20 MG capsule Take 20 mg by mouth daily.    Marland Kitchen HYDROcodone-acetaminophen (NORCO/VICODIN) 5-325 MG per tablet Take 1 tablet by mouth every 6 (six) hours as  needed for moderate pain.    . isosorbide mononitrate (IMDUR) 30 MG 24 hr tablet Take 1 tablet (30 mg total) by mouth daily. Must keep appt scheduled on 09/15/19 with Dr Irish Lack to cont refills. Thanks. 30 tablet 1  . lisinopril (PRINIVIL,ZESTRIL) 20 MG tablet Take 1 tablet (20 mg total) by mouth daily. 90 tablet 0  . LORazepam (ATIVAN) 1 MG tablet Take 1 mg by mouth daily. Take 0.5 in the morning and 0.5 in the evening.    . nitroGLYCERIN (NITROSTAT) 0.4 MG SL tablet Place 1 tablet (0.4 mg total) under the tongue every 5 (five) minutes as needed for chest pain. X 3 doses 25 tablet 11   No current facility-administered medications for this visit.    Allergies:   Morphine and related    Social History:  The patient  reports that she has been smoking. She has never used smokeless tobacco. She  reports current alcohol use. She reports that she does not use drugs.   Family History:  The patient's family history includes CAD in her father; CVA in her brother and father; Heart attack in her sister; Heart failure in her father; Hypertension in her brother; Ovarian cancer in her mother.    ROS:  Please see the history of present illness.   Otherwise, review of systems are positive for needs some teeth pulled.   All other systems are reviewed and negative.    PHYSICAL EXAM: VS:  BP 134/72   Pulse 73   Ht 5\' 2"  (1.575 m)   Wt 151 lb 12.8 oz (68.9 kg)   SpO2 96%   BMI 27.76 kg/m  , BMI Body mass index is 27.76 kg/m. GEN: Well nourished, well developed, in no acute distress  HEENT: normal  Neck: no JVD, carotid bruits, or masses Cardiac: RRR; no murmurs, rubs, or gallops,no edema  Respiratory:  clear to auscultation bilaterally, normal work of breathing GI: soft, nontender, nondistended, + BS MS: no deformity or atrophy  Skin: warm and dry, no rash Neuro:  Strength and sensation are intact Psych: euthymic mood, full affect   EKG:   The ekg ordered today demonstrates NSR, no ST changes, sinus arrhythmia, small inferior Q waves, no change from prior   Recent Labs: No results found for requested labs within last 8760 hours.   Lipid Panel    Component Value Date/Time   CHOL 159 01/20/2018 1150   CHOL 142 05/31/2014 0853   TRIG 241 (H) 01/20/2018 1150   TRIG 256 (H) 05/31/2014 0853   HDL 58 01/20/2018 1150   HDL 59 05/31/2014 0853   CHOLHDL 2.7 01/20/2018 1150   CHOLHDL 2.8 12/11/2015 0840   VLDL 59 (H) 12/11/2015 0840   LDLCALC 53 01/20/2018 1150   LDLCALC 32 05/31/2014 0853     Other studies Reviewed: Additional studies/ records that were reviewed today with results demonstrating: labs reviewed.   ASSESSMENT AND PLAN:  1.   CAD: No angina.  COntinue aggressive secondary prevention.  No bleeding problems on dual antiplatelet therapy.  Okay to hold clopidogrel 5  days in anticipation of procedures like she did for her colonoscopy. 2.   Hyperlipidemia: The current medical regimen is effective;  continue present plan and medications.  Whole food, plant-based diet recommended.  Avoid processed foods. 3.   Tobacco abuse: She needs to stop smoking.   Current medicines are reviewed at length with the patient today.  The patient concerns regarding her medicines were addressed.  The following changes have  been made:  No change  Labs/ tests ordered today include:  No orders of the defined types were placed in this encounter.   Recommend 150 minutes/week of aerobic exercise Low fat, low carb, high fiber diet recommended  Disposition:   FU in 1 year   Signed, Larae Grooms, MD  09/15/2019 9:00 AM    [facility] [address], [place], [place]  [address] Phone: [phone]; Fax: [phone]

## 2019-09-15 ENCOUNTER — Ambulatory Visit (INDEPENDENT_AMBULATORY_CARE_PROVIDER_SITE_OTHER): Payer: Medicare Other | Admitting: Interventional Cardiology

## 2019-09-15 ENCOUNTER — Other Ambulatory Visit: Payer: Self-pay

## 2019-09-15 ENCOUNTER — Encounter: Payer: Self-pay | Admitting: Interventional Cardiology

## 2019-09-15 VITALS — BP 134/72 | HR 73 | Ht 62.0 in | Wt 151.8 lb

## 2019-09-15 DIAGNOSIS — E782 Mixed hyperlipidemia: Secondary | ICD-10-CM

## 2019-09-15 DIAGNOSIS — Z720 Tobacco use: Secondary | ICD-10-CM | POA: Diagnosis not present

## 2019-09-15 DIAGNOSIS — I25118 Atherosclerotic heart disease of native coronary artery with other forms of angina pectoris: Secondary | ICD-10-CM

## 2019-09-15 MED ORDER — ATORVASTATIN CALCIUM 80 MG PO TABS: 80.0000 mg | ORAL_TABLET | Freq: Every day | ORAL | 3 refills | Status: DC

## 2019-09-15 MED ORDER — LISINOPRIL 20 MG PO TABS: 20.0000 mg | ORAL_TABLET | Freq: Every day | ORAL | 3 refills | Status: DC

## 2019-09-15 MED ORDER — ISOSORBIDE MONONITRATE ER 30 MG PO TB24: 30.0000 mg | ORAL_TABLET | Freq: Every day | ORAL | 3 refills | Status: DC

## 2019-09-15 MED ORDER — CLOPIDOGREL BISULFATE 75 MG PO TABS: 75.0000 mg | ORAL_TABLET | Freq: Every day | ORAL | 3 refills | Status: DC

## 2019-09-15 MED ORDER — NITROGLYCERIN 0.4 MG SL SUBL: 0.4000 mg | SUBLINGUAL_TABLET | SUBLINGUAL | 3 refills | Status: DC | PRN

## 2019-09-15 NOTE — Patient Instructions (Signed)

## 2019-09-16 ENCOUNTER — Ambulatory Visit: Payer: Medicare Other | Admitting: Physician Assistant

## 2019-11-23 ENCOUNTER — Other Ambulatory Visit: Payer: Self-pay | Admitting: Family Medicine

## 2019-11-23 ENCOUNTER — Other Ambulatory Visit: Payer: Self-pay | Admitting: Internal Medicine

## 2019-11-23 ENCOUNTER — Ambulatory Visit
Admission: RE | Admit: 2019-11-23 | Discharge: 2019-11-23 | Disposition: A | Discharge status: Home / Self Care | Payer: Medicare Other | Source: Ambulatory Visit | Attending: Internal Medicine | Admitting: Internal Medicine

## 2019-11-23 DIAGNOSIS — M79604 Pain in right leg: Secondary | ICD-10-CM

## 2019-11-23 DIAGNOSIS — R1031 Right lower quadrant pain: Secondary | ICD-10-CM

## 2019-11-24 ENCOUNTER — Ambulatory Visit
Admission: RE | Admit: 2019-11-24 | Discharge: 2019-11-24 | Disposition: A | Discharge status: Home / Self Care | Payer: Medicare Other | Source: Ambulatory Visit | Attending: Internal Medicine | Admitting: Internal Medicine

## 2019-11-24 DIAGNOSIS — R1031 Right lower quadrant pain: Secondary | ICD-10-CM

## 2019-11-24 DIAGNOSIS — M79604 Pain in right leg: Secondary | ICD-10-CM

## 2020-09-06 DIAGNOSIS — M810 Age-related osteoporosis without current pathological fracture: Secondary | ICD-10-CM | POA: Diagnosis not present

## 2020-09-06 DIAGNOSIS — I25119 Atherosclerotic heart disease of native coronary artery with unspecified angina pectoris: Secondary | ICD-10-CM | POA: Diagnosis not present

## 2020-09-06 DIAGNOSIS — M1611 Unilateral primary osteoarthritis, right hip: Secondary | ICD-10-CM | POA: Diagnosis not present

## 2020-09-06 DIAGNOSIS — I10 Essential (primary) hypertension: Secondary | ICD-10-CM | POA: Diagnosis not present

## 2020-09-06 DIAGNOSIS — E782 Mixed hyperlipidemia: Secondary | ICD-10-CM | POA: Diagnosis not present

## 2020-09-10 ENCOUNTER — Other Ambulatory Visit: Payer: Self-pay | Admitting: Interventional Cardiology

## 2020-09-13 DIAGNOSIS — I10 Essential (primary) hypertension: Secondary | ICD-10-CM | POA: Diagnosis not present

## 2020-09-13 DIAGNOSIS — E782 Mixed hyperlipidemia: Secondary | ICD-10-CM | POA: Diagnosis not present

## 2020-09-13 DIAGNOSIS — M810 Age-related osteoporosis without current pathological fracture: Secondary | ICD-10-CM | POA: Diagnosis not present

## 2020-09-13 DIAGNOSIS — I25119 Atherosclerotic heart disease of native coronary artery with unspecified angina pectoris: Secondary | ICD-10-CM | POA: Diagnosis not present

## 2020-09-13 DIAGNOSIS — M1611 Unilateral primary osteoarthritis, right hip: Secondary | ICD-10-CM | POA: Diagnosis not present

## 2020-09-15 DIAGNOSIS — Z03818 Encounter for observation for suspected exposure to other biological agents ruled out: Secondary | ICD-10-CM | POA: Diagnosis not present

## 2020-09-15 DIAGNOSIS — Z20822 Contact with and (suspected) exposure to covid-19: Secondary | ICD-10-CM | POA: Diagnosis not present

## 2020-10-02 ENCOUNTER — Other Ambulatory Visit: Payer: Self-pay | Admitting: Interventional Cardiology

## 2020-10-16 ENCOUNTER — Other Ambulatory Visit: Payer: Self-pay | Admitting: Interventional Cardiology

## 2020-10-22 DIAGNOSIS — Z03818 Encounter for observation for suspected exposure to other biological agents ruled out: Secondary | ICD-10-CM | POA: Diagnosis not present

## 2020-10-22 DIAGNOSIS — Z20822 Contact with and (suspected) exposure to covid-19: Secondary | ICD-10-CM | POA: Diagnosis not present

## 2020-11-06 DIAGNOSIS — I10 Essential (primary) hypertension: Secondary | ICD-10-CM | POA: Diagnosis not present

## 2020-11-06 DIAGNOSIS — M810 Age-related osteoporosis without current pathological fracture: Secondary | ICD-10-CM | POA: Diagnosis not present

## 2020-11-06 DIAGNOSIS — E782 Mixed hyperlipidemia: Secondary | ICD-10-CM | POA: Diagnosis not present

## 2020-11-06 DIAGNOSIS — M1611 Unilateral primary osteoarthritis, right hip: Secondary | ICD-10-CM | POA: Diagnosis not present

## 2020-11-06 DIAGNOSIS — I25119 Atherosclerotic heart disease of native coronary artery with unspecified angina pectoris: Secondary | ICD-10-CM | POA: Diagnosis not present

## 2020-11-07 ENCOUNTER — Other Ambulatory Visit: Payer: Self-pay | Admitting: Interventional Cardiology

## 2020-11-27 DIAGNOSIS — L509 Urticaria, unspecified: Secondary | ICD-10-CM | POA: Diagnosis not present

## 2020-11-28 ENCOUNTER — Other Ambulatory Visit: Payer: Self-pay | Admitting: Interventional Cardiology

## 2020-12-04 ENCOUNTER — Other Ambulatory Visit: Payer: Self-pay | Admitting: Interventional Cardiology

## 2020-12-08 DIAGNOSIS — I10 Essential (primary) hypertension: Secondary | ICD-10-CM | POA: Diagnosis not present

## 2020-12-08 DIAGNOSIS — M1611 Unilateral primary osteoarthritis, right hip: Secondary | ICD-10-CM | POA: Diagnosis not present

## 2020-12-08 DIAGNOSIS — M810 Age-related osteoporosis without current pathological fracture: Secondary | ICD-10-CM | POA: Diagnosis not present

## 2020-12-08 DIAGNOSIS — E782 Mixed hyperlipidemia: Secondary | ICD-10-CM | POA: Diagnosis not present

## 2020-12-08 DIAGNOSIS — I25119 Atherosclerotic heart disease of native coronary artery with unspecified angina pectoris: Secondary | ICD-10-CM | POA: Diagnosis not present

## 2020-12-18 ENCOUNTER — Other Ambulatory Visit: Payer: Self-pay | Admitting: Interventional Cardiology

## 2020-12-18 DIAGNOSIS — H60502 Unspecified acute noninfective otitis externa, left ear: Secondary | ICD-10-CM | POA: Diagnosis not present

## 2020-12-18 DIAGNOSIS — H61002 Unspecified perichondritis of left external ear: Secondary | ICD-10-CM | POA: Diagnosis not present

## 2020-12-18 DIAGNOSIS — H6123 Impacted cerumen, bilateral: Secondary | ICD-10-CM | POA: Diagnosis not present

## 2020-12-22 DIAGNOSIS — H61002 Unspecified perichondritis of left external ear: Secondary | ICD-10-CM | POA: Diagnosis not present

## 2020-12-27 DIAGNOSIS — I25119 Atherosclerotic heart disease of native coronary artery with unspecified angina pectoris: Secondary | ICD-10-CM | POA: Diagnosis not present

## 2020-12-27 DIAGNOSIS — M1611 Unilateral primary osteoarthritis, right hip: Secondary | ICD-10-CM | POA: Diagnosis not present

## 2020-12-27 DIAGNOSIS — M810 Age-related osteoporosis without current pathological fracture: Secondary | ICD-10-CM | POA: Diagnosis not present

## 2020-12-27 DIAGNOSIS — E782 Mixed hyperlipidemia: Secondary | ICD-10-CM | POA: Diagnosis not present

## 2020-12-27 DIAGNOSIS — I10 Essential (primary) hypertension: Secondary | ICD-10-CM | POA: Diagnosis not present

## 2021-01-01 ENCOUNTER — Other Ambulatory Visit: Payer: Self-pay | Admitting: Interventional Cardiology

## 2021-01-08 ENCOUNTER — Other Ambulatory Visit: Payer: Self-pay | Admitting: Interventional Cardiology

## 2021-01-24 DIAGNOSIS — L821 Other seborrheic keratosis: Secondary | ICD-10-CM | POA: Diagnosis not present

## 2021-01-24 DIAGNOSIS — D1801 Hemangioma of skin and subcutaneous tissue: Secondary | ICD-10-CM | POA: Diagnosis not present

## 2021-01-24 DIAGNOSIS — L988 Other specified disorders of the skin and subcutaneous tissue: Secondary | ICD-10-CM | POA: Diagnosis not present

## 2021-01-24 DIAGNOSIS — L820 Inflamed seborrheic keratosis: Secondary | ICD-10-CM | POA: Diagnosis not present

## 2021-01-29 ENCOUNTER — Other Ambulatory Visit: Payer: Self-pay | Admitting: *Deleted

## 2021-01-29 ENCOUNTER — Telehealth: Payer: Self-pay | Admitting: Interventional Cardiology

## 2021-01-29 MED ORDER — ISOSORBIDE MONONITRATE ER 30 MG PO TB24: 30.0000 mg | ORAL_TABLET | Freq: Every day | ORAL | 0 refills | Status: DC

## 2021-01-29 NOTE — Telephone Encounter (Signed)
*  STAT* If patient is at the pharmacy, call can be transferred to refill team.   1. Which medications need to be refilled? (please list name of each medication and dose if known) isosorbide mononitrate (IMDUR) 30 MG 24 hr tablet  2. Which pharmacy/location (including street and city if local pharmacy) is medication to be sent to? [address] [address]  3. Do they need a 30 day or 90 day supply? 90 day supply   Pt has appt scheduled with Dr. Irish Lack on 05/18/21

## 2021-01-29 NOTE — Telephone Encounter (Signed)
Rx has been sent in as requested. Confirmation received. 

## 2021-01-30 ENCOUNTER — Other Ambulatory Visit: Payer: Self-pay | Admitting: Interventional Cardiology

## 2021-02-06 DIAGNOSIS — E782 Mixed hyperlipidemia: Secondary | ICD-10-CM | POA: Diagnosis not present

## 2021-02-06 DIAGNOSIS — M810 Age-related osteoporosis without current pathological fracture: Secondary | ICD-10-CM | POA: Diagnosis not present

## 2021-02-06 DIAGNOSIS — I25119 Atherosclerotic heart disease of native coronary artery with unspecified angina pectoris: Secondary | ICD-10-CM | POA: Diagnosis not present

## 2021-02-06 DIAGNOSIS — I10 Essential (primary) hypertension: Secondary | ICD-10-CM | POA: Diagnosis not present

## 2021-02-23 ENCOUNTER — Other Ambulatory Visit: Payer: Self-pay | Admitting: Interventional Cardiology

## 2021-02-23 DIAGNOSIS — M810 Age-related osteoporosis without current pathological fracture: Secondary | ICD-10-CM | POA: Diagnosis not present

## 2021-02-23 DIAGNOSIS — I10 Essential (primary) hypertension: Secondary | ICD-10-CM | POA: Diagnosis not present

## 2021-02-23 DIAGNOSIS — E782 Mixed hyperlipidemia: Secondary | ICD-10-CM | POA: Diagnosis not present

## 2021-02-23 DIAGNOSIS — M1611 Unilateral primary osteoarthritis, right hip: Secondary | ICD-10-CM | POA: Diagnosis not present

## 2021-03-28 DIAGNOSIS — E782 Mixed hyperlipidemia: Secondary | ICD-10-CM | POA: Diagnosis not present

## 2021-03-28 DIAGNOSIS — M1611 Unilateral primary osteoarthritis, right hip: Secondary | ICD-10-CM | POA: Diagnosis not present

## 2021-03-28 DIAGNOSIS — I10 Essential (primary) hypertension: Secondary | ICD-10-CM | POA: Diagnosis not present

## 2021-03-28 DIAGNOSIS — I25119 Atherosclerotic heart disease of native coronary artery with unspecified angina pectoris: Secondary | ICD-10-CM | POA: Diagnosis not present

## 2021-03-28 DIAGNOSIS — M810 Age-related osteoporosis without current pathological fracture: Secondary | ICD-10-CM | POA: Diagnosis not present

## 2021-04-24 ENCOUNTER — Other Ambulatory Visit: Payer: Self-pay | Admitting: Interventional Cardiology

## 2021-04-25 DIAGNOSIS — I25119 Atherosclerotic heart disease of native coronary artery with unspecified angina pectoris: Secondary | ICD-10-CM | POA: Diagnosis not present

## 2021-04-25 DIAGNOSIS — I10 Essential (primary) hypertension: Secondary | ICD-10-CM | POA: Diagnosis not present

## 2021-04-25 DIAGNOSIS — M1611 Unilateral primary osteoarthritis, right hip: Secondary | ICD-10-CM | POA: Diagnosis not present

## 2021-04-25 DIAGNOSIS — E782 Mixed hyperlipidemia: Secondary | ICD-10-CM | POA: Diagnosis not present

## 2021-04-25 DIAGNOSIS — M810 Age-related osteoporosis without current pathological fracture: Secondary | ICD-10-CM | POA: Diagnosis not present

## 2021-05-17 NOTE — Progress Notes (Signed)
Cardiology Office Note   Date:  05/18/2021   ID:  Geryl, Dohn 20-May-1946, MRN [id number]  PCP:  Lavone Orn, MD    No chief complaint on file.  CAD  Wt Readings from Last 3 Encounters:  05/18/21 138 lb (62.6 kg)  09/15/19 151 lb 12.8 oz (68.9 kg)  01/27/18 158 lb (71.7 kg)       History of Present Illness: [name] is a 75 y.o. female  who has had multivessel stents placed, in 2003 and 2009. She had a proximal LAD stent placed in 2003, 3.0 x 18 Cypher.  She had SHOB prior to her stents and an abnormal ETT in 2003. She did have chest pain prior to the the first stent being placed. She woke up with chest pain on one occasion before the stents, and the other was [facility] with exertion back in 2003.    She had stents to the RCA and ramus in 2009.     Husband was ill with cancer in 2018.   Due to angina, had a nuclear stress test in 2018 showing: "Nuclear stress EF: 85%. Blood pressure demonstrated a normal response to exercise. There was no ST segment deviation noted during stress. The study is normal. The left ventricular ejection fraction is hyperdynamic (>65%).   1. EF > 60%, normal wall motion.  2. No ischemia or infarction by perfusion images.  3. Normal exercise tolerance with no ischemic ST changes on exercise ECG.    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  She has had to hold Plavix for 5 days in the past for colonoscopy as well as teeth removal.   She does walk at work, 3x/week.    Denies : Chest pain. Dizziness. Leg edema. Nitroglycerin use. Orthopnea. Palpitations. Paroxysmal nocturnal dyspnea. Shortness of breath. Syncope.      Past Medical History:  Diagnosis Date   ASCVD (arteriosclerotic cardiovascular disease)    multivessel, s/p DE stent, LAD proximal and mid, large proximal ramus 12/23/2007---s/p DE stent implant, mid and proximal RCA 01/26/08   Coronary atherosclerosis  of native coronary artery    Depression    Dyslipidemia    Essential hypertension, benign    Fibromyalgia syndrome    Fracture of right fibula    11/12   GERD (gastroesophageal reflux disease)    H/O cardiovascular stress test    abnormal   Tobacco use disorder     No past surgical history on file.   Current Outpatient Medications  Medication Sig Dispense Refill   aspirin 81 MG tablet Take 1 tablet (81 mg total) by mouth daily.     atorvastatin (LIPITOR) 80 MG tablet TAKE ONE TABLET BY MOUTH EVERY MORNING 90 tablet 2   cholecalciferol (VITAMIN D) 1000 UNITS tablet Take 1,000 Units by mouth daily.     clopidogrel (PLAVIX) 75 MG tablet TAKE ONE TABLET BY MOUTH EVERY MORNING 90 tablet 0   fish oil-omega-3 fatty acids 1000 MG capsule Take 2 g by mouth 2 (two) times daily.     FLUoxetine (PROZAC) 20 MG capsule Take 20 mg by mouth daily.     gabapentin (NEURONTIN) 100 MG capsule Take 100 mg by mouth 3 (three) times daily.     HYDROcodone-acetaminophen (NORCO/VICODIN) 5-325 MG per tablet Take 1 tablet by mouth every 6 (six) hours as needed for moderate pain.     isosorbide mononitrate (IMDUR) 30 MG 24 hr  tablet TAKE ONE TABLET BY MOUTH ONCE DAILY 90 tablet 0   lisinopril (ZESTRIL) 20 MG tablet TAKE ONE TABLET BY MOUTH EVERY MORNING 30 tablet 3   LORazepam (ATIVAN) 1 MG tablet Take 1 mg by mouth daily. Take 0.5 in the morning and 0.5 in the evening.     nitroGLYCERIN (NITROSTAT) 0.4 MG SL tablet Place 1 tablet (0.4 mg total) under the tongue every 5 (five) minutes as needed for chest pain. X 3 doses 25 tablet 3   No current facility-administered medications for this visit.    Allergies:   Morphine and related and Other    Social History:  The patient  reports that she has been smoking. She has never used smokeless tobacco. She reports current alcohol use. She reports that she does not use drugs.   Family History:  The patient's family history includes CAD in her father; CVA in her  brother and father; Heart attack in her sister; Heart failure in her father; Hypertension in her brother; Ovarian cancer in her mother.    ROS:  Please see the history of present illness.   Otherwise, review of systems are positive for occasional leg cramps at night; no nonhealing sores.   All other systems are reviewed and negative.    PHYSICAL EXAM: VS:  BP 110/80   Pulse 71   Ht 5\' 2"  (1.575 m)   Wt 138 lb (62.6 kg)   SpO2 96%   BMI 25.24 kg/m  , BMI Body mass index is 25.24 kg/m. GEN: Well nourished, well developed, in no acute distress HEENT: normal Neck: no JVD, carotid bruits, or masses Cardiac: RRR; no murmurs, rubs, or gallops,no edema  Respiratory:  clear to auscultation bilaterally, normal work of breathing GI: soft, nontender, nondistended, + BS MS: no deformity or atrophy Skin: warm and dry, no rash Neuro:  Strength and sensation are intact Psych: euthymic mood, full affect   EKG:   The ekg ordered today demonstrates NSR, no ST changes   Recent Labs: No results found for requested labs within last 8760 hours.   Lipid Panel    Component Value Date/Time   CHOL 159 01/20/2018 1150   CHOL 142 05/31/2014 0853   TRIG 241 (H) 01/20/2018 1150   TRIG 256 (H) 05/31/2014 0853   HDL 58 01/20/2018 1150   HDL 59 05/31/2014 0853   CHOLHDL 2.7 01/20/2018 1150   CHOLHDL 2.8 12/11/2015 0840   VLDL 59 (H) 12/11/2015 0840   LDLCALC 53 01/20/2018 1150   LDLCALC 32 05/31/2014 0853     Other studies Reviewed: Additional studies/ records that were reviewed today with results demonstrating: labs reviewed.   ASSESSMENT AND PLAN:  CAD: No angina on medical therapy. Decreased red meat since her husband passed away.  Cooking less. Hyperlipidemia: The current medical regimen is effective;  continue present plan and medications. Tobacco abuse: She needs to stop smoking.  She has cut back to 4 cigs/day. PreDM: A1C 6.3.  Check A1C today.  Whole food, plant-based diet.   High-fiber foods recommended.   Current medicines are reviewed at length with the patient today.  The patient concerns regarding her medicines were addressed.  The following changes have been made:  No change  Labs/ tests ordered today include:  No orders of the defined types were placed in this encounter.   Recommend 150 minutes/week of aerobic exercise Low fat, low carb, high fiber diet recommended  Disposition:   FU in 1 year   Signed, Larae Grooms,  MD  05/18/2021 11:00 AM    [facility] [address], [place], [place]  [address] Phone: [phone]; Fax: [phone]

## 2021-05-18 ENCOUNTER — Ambulatory Visit (INDEPENDENT_AMBULATORY_CARE_PROVIDER_SITE_OTHER): Payer: Medicare Other | Admitting: Interventional Cardiology

## 2021-05-18 ENCOUNTER — Other Ambulatory Visit: Payer: Self-pay

## 2021-05-18 ENCOUNTER — Encounter: Payer: Self-pay | Admitting: Interventional Cardiology

## 2021-05-18 VITALS — BP 110/80 | HR 71 | Ht 62.0 in | Wt 138.0 lb

## 2021-05-18 DIAGNOSIS — I25118 Atherosclerotic heart disease of native coronary artery with other forms of angina pectoris: Secondary | ICD-10-CM

## 2021-05-18 DIAGNOSIS — Z720 Tobacco use: Secondary | ICD-10-CM | POA: Diagnosis not present

## 2021-05-18 DIAGNOSIS — R739 Hyperglycemia, unspecified: Secondary | ICD-10-CM

## 2021-05-18 DIAGNOSIS — E782 Mixed hyperlipidemia: Secondary | ICD-10-CM | POA: Diagnosis not present

## 2021-05-18 LAB — COMPREHENSIVE METABOLIC PANEL
ALT: 12 IU/L (ref 0–32)
AST: 16 IU/L (ref 0–40)
Albumin/Globulin Ratio: 2.1 (ref 1.2–2.2)
Albumin: 4.2 g/dL (ref 3.7–4.7)
Alkaline Phosphatase: 78 IU/L (ref 44–121)
BUN/Creatinine Ratio: 16 (ref 12–28)
BUN: 13 mg/dL (ref 8–27)
Bilirubin Total: 0.3 mg/dL (ref 0.0–1.2)
CO2: 22 mmol/L (ref 20–29)
Calcium: 9.2 mg/dL (ref 8.7–10.3)
Chloride: 99 mmol/L (ref 96–106)
Creatinine, Ser: 0.81 mg/dL (ref 0.57–1.00)
Globulin, Total: 2 g/dL (ref 1.5–4.5)
Glucose: 94 mg/dL (ref 70–99)
Potassium: 5 mmol/L (ref 3.5–5.2)
Sodium: 134 mmol/L (ref 134–144)
Total Protein: 6.2 g/dL (ref 6.0–8.5)
eGFR: 76 mL/min/{1.73_m2} (ref >59)

## 2021-05-18 LAB — LIPID PANEL
Chol/HDL Ratio: 3 ratio (ref 0.0–4.4)
Cholesterol, Total: 160 mg/dL (ref 100–199)
HDL: 54 mg/dL (ref >39)
LDL Chol Calc (NIH): 70 mg/dL (ref 0–99)
Triglycerides: 220 mg/dL — ABNORMAL HIGH (ref 0–149)
VLDL Cholesterol Cal: 36 mg/dL (ref 5–40)

## 2021-05-18 LAB — HEMOGLOBIN A1C
Est. average glucose Bld gHb Est-mCnc: 120 mg/dL
Hgb A1c MFr Bld: 5.8 % — ABNORMAL HIGH (ref 4.8–5.6)

## 2021-05-18 LAB — CBC
Hematocrit: 39.8 % (ref 34.0–46.6)
Hemoglobin: 13.6 g/dL (ref 11.1–15.9)
MCH: 31 pg (ref 26.6–33.0)
MCHC: 34.2 g/dL (ref 31.5–35.7)
MCV: 91 fL (ref 79–97)
Platelets: 256 10*3/uL (ref 150–450)
RBC: 4.39 x10E6/uL (ref 3.77–5.28)
RDW: 11.4 % — ABNORMAL LOW (ref 11.7–15.4)
WBC: 4.6 10*3/uL (ref 3.4–10.8)

## 2021-05-18 NOTE — Patient Instructions (Signed)
Medication Instructions:  Your physician recommends that you continue on your current medications as directed. Please refer to the Current Medication list given to you today.  *If you need a refill on your cardiac medications before your next appointment, please call your pharmacy*   Lab Work: Lab work to be done today--lipids, CMET, CBC, A1c If you have labs (blood work) drawn today and your tests are completely normal, you will receive your results only by: [place] (if you have MyChart) OR A paper copy in the mail If you have any lab test that is abnormal or we need to change your treatment, we will call you to review the results.   Testing/Procedures: none   Follow-Up: At [facility], you and your health needs are our priority.  As part of our continuing mission to provide you with exceptional heart care, we have created designated Provider Care Teams.  These Care Teams include your primary Cardiologist (physician) and Advanced Practice Providers (APPs -  Physician Assistants and Nurse Practitioners) who all work together to provide you with the care you need, when you need it.  We recommend signing up for the patient portal called "MyChart".  Sign up information is provided on this After Visit Summary.  MyChart is used to connect with patients for Virtual Visits (Telemedicine).  Patients are able to view lab/test results, encounter notes, upcoming appointments, etc.  Non-urgent messages can be sent to your provider as well.   To learn more about what you can do with MyChart, go to [email or web address].    Your next appointment:   12 month(s)  The format for your next appointment:   In Person  Provider:   You may see Larae Grooms, MD or one of the following Advanced Practice Providers on your designated Care Team:   Melina Copa, PA-C Ermalinda Barrios, PA-C   Other Instructions

## 2021-05-20 ENCOUNTER — Other Ambulatory Visit: Payer: Self-pay | Admitting: Interventional Cardiology

## 2021-05-22 ENCOUNTER — Telehealth: Payer: Self-pay | Admitting: *Deleted

## 2021-05-22 MED ORDER — ICOSAPENT ETHYL 1 G PO CAPS: 2.0000 g | ORAL_CAPSULE | Freq: Two times a day (BID) | ORAL | 3 refills | Status: DC

## 2021-05-22 NOTE — Telephone Encounter (Signed)
Patient notified.  She is aware to stop OTC fish oil.  Patient will let us know if [name] is too expensive

## 2021-05-22 NOTE — Telephone Encounter (Signed)
-----   Message from Jettie Booze, MD sent at 05/21/2021  5:46 PM EDT ----- I am fine with trying Vascepa as noted below.  JV ----- Message ----- From: Leeroy Bock, RPH-CPP Sent: 05/21/2021  11:43 AM EDT To: Jettie Booze, MD, #  She'd receive more CV benefit from upgrading her OTC fish oil to Vascepa 2g BID. She has [employer] but also [employer] who I'm hoping would cover Vascepa better - definitely worth sending in a prescription to see. If they don't cover it well, would confirm she was fasting for labs and advise her to work on limiting intake of carbs/sugar/alcohol. Probably would not start fibrate with TG still in the low 220s due to lack of CV benefit, especially if there is room for lifestyle improvement.

## 2021-05-22 NOTE — Telephone Encounter (Signed)
Results and recommendations reviewed with patient. She did have a cookie and coffee with cream and sugar the day of the lab draw. Will check with pharmacist to see if patient should still start Vascepa as lab work was not fasting.

## 2021-05-22 NOTE — Telephone Encounter (Signed)
Her TG are historically high. If Vascepa is affordable, I would try it

## 2021-05-25 DIAGNOSIS — M810 Age-related osteoporosis without current pathological fracture: Secondary | ICD-10-CM | POA: Diagnosis not present

## 2021-05-25 DIAGNOSIS — I10 Essential (primary) hypertension: Secondary | ICD-10-CM | POA: Diagnosis not present

## 2021-05-25 DIAGNOSIS — E782 Mixed hyperlipidemia: Secondary | ICD-10-CM | POA: Diagnosis not present

## 2021-05-25 DIAGNOSIS — I25119 Atherosclerotic heart disease of native coronary artery with unspecified angina pectoris: Secondary | ICD-10-CM | POA: Diagnosis not present

## 2021-05-25 DIAGNOSIS — M1611 Unilateral primary osteoarthritis, right hip: Secondary | ICD-10-CM | POA: Diagnosis not present

## 2021-06-13 DIAGNOSIS — Z1231 Encounter for screening mammogram for malignant neoplasm of breast: Secondary | ICD-10-CM | POA: Diagnosis not present

## 2021-06-13 DIAGNOSIS — Z0000 Encounter for general adult medical examination without abnormal findings: Secondary | ICD-10-CM | POA: Diagnosis not present

## 2021-06-13 DIAGNOSIS — M810 Age-related osteoporosis without current pathological fracture: Secondary | ICD-10-CM | POA: Diagnosis not present

## 2021-06-13 DIAGNOSIS — Z1389 Encounter for screening for other disorder: Secondary | ICD-10-CM | POA: Diagnosis not present

## 2021-06-13 DIAGNOSIS — I25119 Atherosclerotic heart disease of native coronary artery with unspecified angina pectoris: Secondary | ICD-10-CM | POA: Diagnosis not present

## 2021-06-13 DIAGNOSIS — E782 Mixed hyperlipidemia: Secondary | ICD-10-CM | POA: Diagnosis not present

## 2021-06-13 DIAGNOSIS — I10 Essential (primary) hypertension: Secondary | ICD-10-CM | POA: Diagnosis not present

## 2021-06-13 DIAGNOSIS — R7301 Impaired fasting glucose: Secondary | ICD-10-CM | POA: Diagnosis not present

## 2021-06-13 DIAGNOSIS — G894 Chronic pain syndrome: Secondary | ICD-10-CM | POA: Diagnosis not present

## 2021-06-13 DIAGNOSIS — Z23 Encounter for immunization: Secondary | ICD-10-CM | POA: Diagnosis not present

## 2021-06-15 ENCOUNTER — Other Ambulatory Visit: Payer: Self-pay | Admitting: Internal Medicine

## 2021-06-15 DIAGNOSIS — Z1231 Encounter for screening mammogram for malignant neoplasm of breast: Secondary | ICD-10-CM

## 2021-06-22 DIAGNOSIS — M1611 Unilateral primary osteoarthritis, right hip: Secondary | ICD-10-CM | POA: Diagnosis not present

## 2021-06-22 DIAGNOSIS — I10 Essential (primary) hypertension: Secondary | ICD-10-CM | POA: Diagnosis not present

## 2021-06-22 DIAGNOSIS — M810 Age-related osteoporosis without current pathological fracture: Secondary | ICD-10-CM | POA: Diagnosis not present

## 2021-06-22 DIAGNOSIS — E782 Mixed hyperlipidemia: Secondary | ICD-10-CM | POA: Diagnosis not present

## 2021-06-22 DIAGNOSIS — I25119 Atherosclerotic heart disease of native coronary artery with unspecified angina pectoris: Secondary | ICD-10-CM | POA: Diagnosis not present

## 2021-07-16 ENCOUNTER — Other Ambulatory Visit: Payer: Self-pay | Admitting: Interventional Cardiology

## 2021-07-25 ENCOUNTER — Ambulatory Visit
Admission: RE | Admit: 2021-07-25 | Discharge: 2021-07-25 | Disposition: A | Discharge status: Home / Self Care | Payer: Medicare Other | Source: Ambulatory Visit | Attending: Internal Medicine | Admitting: Internal Medicine

## 2021-07-25 DIAGNOSIS — M810 Age-related osteoporosis without current pathological fracture: Secondary | ICD-10-CM | POA: Diagnosis not present

## 2021-07-25 DIAGNOSIS — Z1231 Encounter for screening mammogram for malignant neoplasm of breast: Secondary | ICD-10-CM | POA: Diagnosis not present

## 2021-07-25 DIAGNOSIS — E782 Mixed hyperlipidemia: Secondary | ICD-10-CM | POA: Diagnosis not present

## 2021-07-25 DIAGNOSIS — M1611 Unilateral primary osteoarthritis, right hip: Secondary | ICD-10-CM | POA: Diagnosis not present

## 2021-07-25 DIAGNOSIS — I10 Essential (primary) hypertension: Secondary | ICD-10-CM | POA: Diagnosis not present

## 2021-07-25 DIAGNOSIS — I25119 Atherosclerotic heart disease of native coronary artery with unspecified angina pectoris: Secondary | ICD-10-CM | POA: Diagnosis not present

## 2021-08-22 DIAGNOSIS — M810 Age-related osteoporosis without current pathological fracture: Secondary | ICD-10-CM | POA: Diagnosis not present

## 2021-08-22 DIAGNOSIS — E782 Mixed hyperlipidemia: Secondary | ICD-10-CM | POA: Diagnosis not present

## 2021-08-22 DIAGNOSIS — M1611 Unilateral primary osteoarthritis, right hip: Secondary | ICD-10-CM | POA: Diagnosis not present

## 2021-08-22 DIAGNOSIS — I25119 Atherosclerotic heart disease of native coronary artery with unspecified angina pectoris: Secondary | ICD-10-CM | POA: Diagnosis not present

## 2021-08-22 DIAGNOSIS — I10 Essential (primary) hypertension: Secondary | ICD-10-CM | POA: Diagnosis not present

## 2021-09-21 DIAGNOSIS — E782 Mixed hyperlipidemia: Secondary | ICD-10-CM | POA: Diagnosis not present

## 2021-09-21 DIAGNOSIS — J441 Chronic obstructive pulmonary disease with (acute) exacerbation: Secondary | ICD-10-CM | POA: Diagnosis not present

## 2021-09-21 DIAGNOSIS — J209 Acute bronchitis, unspecified: Secondary | ICD-10-CM | POA: Diagnosis not present

## 2021-09-21 DIAGNOSIS — I10 Essential (primary) hypertension: Secondary | ICD-10-CM | POA: Diagnosis not present

## 2021-10-11 ENCOUNTER — Other Ambulatory Visit: Payer: Self-pay | Admitting: Interventional Cardiology

## 2021-11-02 DIAGNOSIS — E782 Mixed hyperlipidemia: Secondary | ICD-10-CM | POA: Diagnosis not present

## 2021-11-02 DIAGNOSIS — I10 Essential (primary) hypertension: Secondary | ICD-10-CM | POA: Diagnosis not present

## 2021-11-09 DIAGNOSIS — H2513 Age-related nuclear cataract, bilateral: Secondary | ICD-10-CM | POA: Diagnosis not present

## 2021-11-22 DIAGNOSIS — J441 Chronic obstructive pulmonary disease with (acute) exacerbation: Secondary | ICD-10-CM | POA: Diagnosis not present

## 2021-11-22 DIAGNOSIS — E782 Mixed hyperlipidemia: Secondary | ICD-10-CM | POA: Diagnosis not present

## 2021-11-22 DIAGNOSIS — I10 Essential (primary) hypertension: Secondary | ICD-10-CM | POA: Diagnosis not present

## 2021-11-22 DIAGNOSIS — I25119 Atherosclerotic heart disease of native coronary artery with unspecified angina pectoris: Secondary | ICD-10-CM | POA: Diagnosis not present

## 2021-11-22 DIAGNOSIS — M810 Age-related osteoporosis without current pathological fracture: Secondary | ICD-10-CM | POA: Diagnosis not present

## 2022-01-15 DIAGNOSIS — H25013 Cortical age-related cataract, bilateral: Secondary | ICD-10-CM | POA: Diagnosis not present

## 2022-01-15 DIAGNOSIS — H2511 Age-related nuclear cataract, right eye: Secondary | ICD-10-CM | POA: Diagnosis not present

## 2022-01-15 DIAGNOSIS — H18413 Arcus senilis, bilateral: Secondary | ICD-10-CM | POA: Diagnosis not present

## 2022-01-15 DIAGNOSIS — H25043 Posterior subcapsular polar age-related cataract, bilateral: Secondary | ICD-10-CM | POA: Diagnosis not present

## 2022-01-15 DIAGNOSIS — H2513 Age-related nuclear cataract, bilateral: Secondary | ICD-10-CM | POA: Diagnosis not present

## 2022-03-04 DIAGNOSIS — H6123 Impacted cerumen, bilateral: Secondary | ICD-10-CM | POA: Diagnosis not present

## 2022-03-04 DIAGNOSIS — H900 Conductive hearing loss, bilateral: Secondary | ICD-10-CM | POA: Diagnosis not present

## 2022-03-29 DIAGNOSIS — H2512 Age-related nuclear cataract, left eye: Secondary | ICD-10-CM | POA: Diagnosis not present

## 2022-03-29 DIAGNOSIS — H2511 Age-related nuclear cataract, right eye: Secondary | ICD-10-CM | POA: Diagnosis not present

## 2022-04-12 DIAGNOSIS — H2512 Age-related nuclear cataract, left eye: Secondary | ICD-10-CM | POA: Diagnosis not present

## 2022-04-17 ENCOUNTER — Other Ambulatory Visit: Payer: Self-pay | Admitting: Interventional Cardiology

## 2022-05-24 ENCOUNTER — Other Ambulatory Visit: Payer: Self-pay | Admitting: Interventional Cardiology

## 2022-06-14 DIAGNOSIS — Z0000 Encounter for general adult medical examination without abnormal findings: Secondary | ICD-10-CM | POA: Diagnosis not present

## 2022-06-14 DIAGNOSIS — E782 Mixed hyperlipidemia: Secondary | ICD-10-CM | POA: Diagnosis not present

## 2022-06-14 DIAGNOSIS — M5431 Sciatica, right side: Secondary | ICD-10-CM | POA: Diagnosis not present

## 2022-06-14 DIAGNOSIS — I10 Essential (primary) hypertension: Secondary | ICD-10-CM | POA: Diagnosis not present

## 2022-06-14 DIAGNOSIS — Z23 Encounter for immunization: Secondary | ICD-10-CM | POA: Diagnosis not present

## 2022-06-14 DIAGNOSIS — R7303 Prediabetes: Secondary | ICD-10-CM | POA: Diagnosis not present

## 2022-06-14 DIAGNOSIS — M797 Fibromyalgia: Secondary | ICD-10-CM | POA: Diagnosis not present

## 2022-06-17 ENCOUNTER — Other Ambulatory Visit: Payer: Self-pay | Admitting: Interventional Cardiology

## 2022-07-18 ENCOUNTER — Other Ambulatory Visit: Payer: Self-pay

## 2022-07-18 ENCOUNTER — Other Ambulatory Visit: Payer: Self-pay | Admitting: Interventional Cardiology

## 2022-07-18 MED ORDER — ICOSAPENT ETHYL 1 G PO CAPS: 2.0000 g | ORAL_CAPSULE | Freq: Two times a day (BID) | ORAL | 0 refills | Status: DC

## 2022-07-18 MED ORDER — CLOPIDOGREL BISULFATE 75 MG PO TABS
75.0000 mg | ORAL_TABLET | Freq: Every morning | ORAL | 0 refills | Status: DC
Start: 2022-07-18 — End: 2022-08-15

## 2022-07-18 MED ORDER — ISOSORBIDE MONONITRATE ER 30 MG PO TB24: 30.0000 mg | ORAL_TABLET | Freq: Every day | ORAL | 0 refills | Status: DC

## 2022-07-18 NOTE — Telephone Encounter (Signed)
Rx refill sent to pharmacy. 

## 2022-08-09 ENCOUNTER — Other Ambulatory Visit: Payer: Self-pay | Admitting: Internal Medicine

## 2022-08-09 DIAGNOSIS — Z1231 Encounter for screening mammogram for malignant neoplasm of breast: Secondary | ICD-10-CM

## 2022-08-15 ENCOUNTER — Other Ambulatory Visit: Payer: Self-pay | Admitting: Interventional Cardiology

## 2022-08-16 ENCOUNTER — Telehealth: Payer: Self-pay | Admitting: Interventional Cardiology

## 2022-08-16 NOTE — Telephone Encounter (Signed)
*  STAT* If patient is at the pharmacy, call can be transferred to refill team.   1. Which medications need to be refilled? (please list name of each medication and dose if known) Lisinopril, Vascepa, Clopidogrel and Isosorbide  2. Which pharmacy/location (including street and city if local pharmacy) is medication to be sent to? [facility]  3. Do they need a 30 day or 90 day supply? 90 days and refills

## 2022-09-04 IMAGING — MG MM DIGITAL SCREENING BILAT W/ TOMO AND CAD
8 series · 8 of 24 positions shown · non-contrast
Comparison: Previous exam(s).

CLINICAL DATA: Screening.

EXAM:
DIGITAL SCREENING BILATERAL MAMMOGRAM WITH TOMOSYNTHESIS AND CAD
TECHNIQUE: Bilateral screening digital craniocaudal and mediolateral oblique
mammograms were obtained. Bilateral screening digital breast
tomosynthesis was performed. The images were evaluated with
computer-aided detection.

[R CC synth-2D]
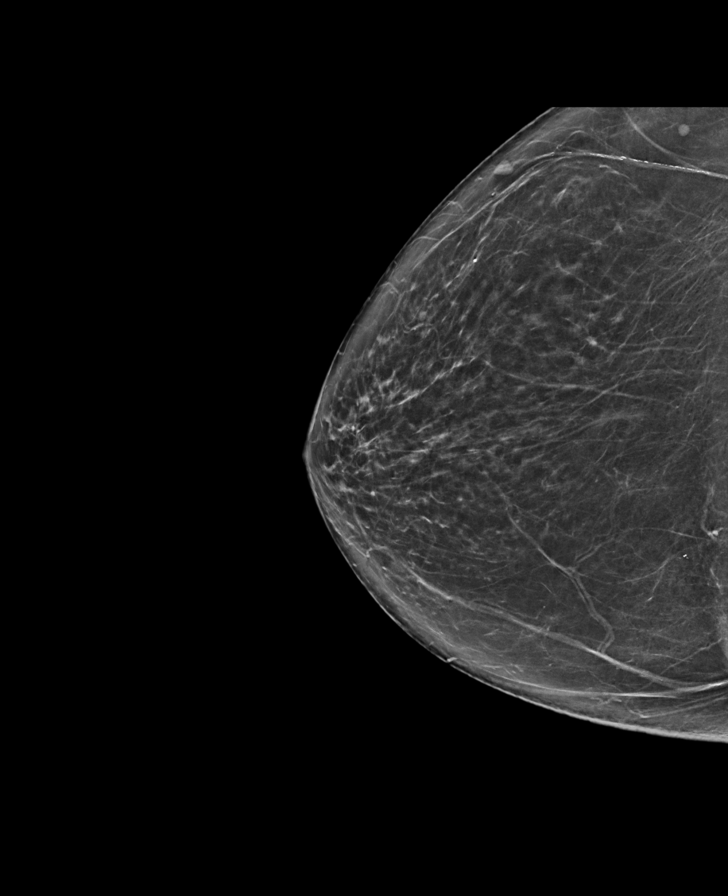

[L CC synth-2D]
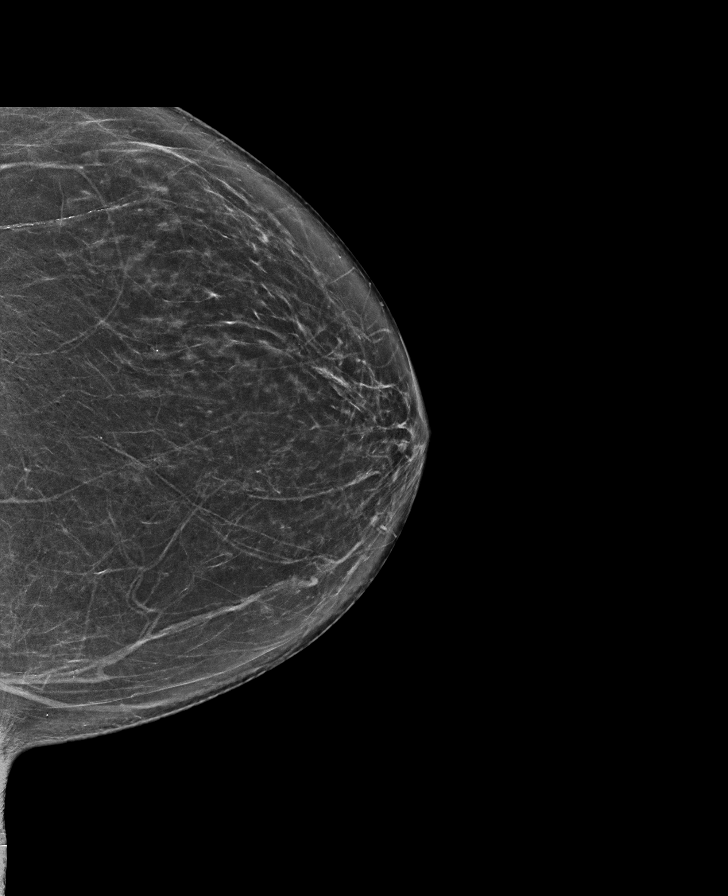

[L MLO synth-2D]
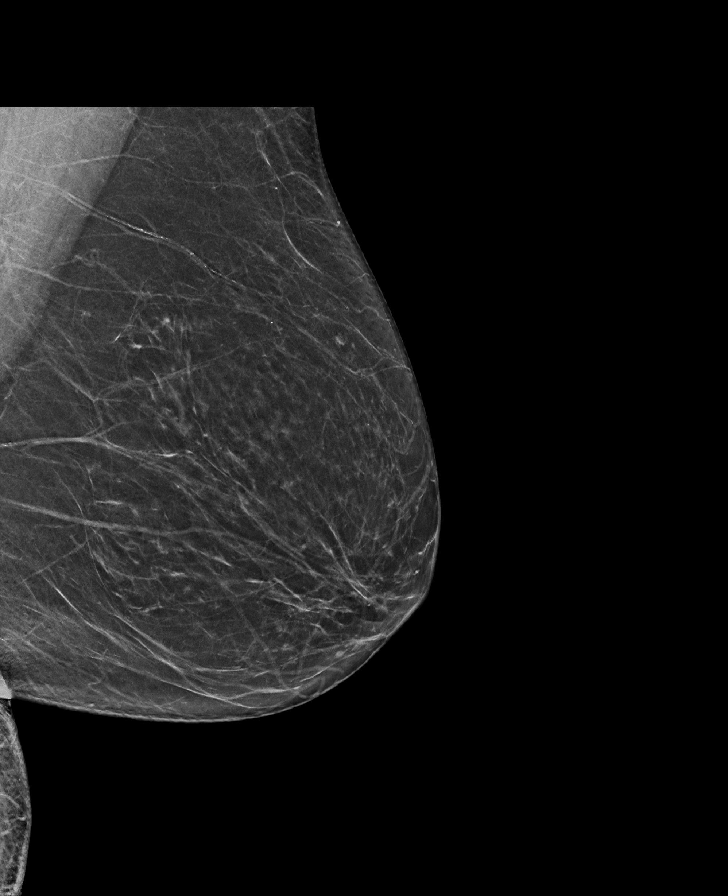

[R MLO synth-2D]
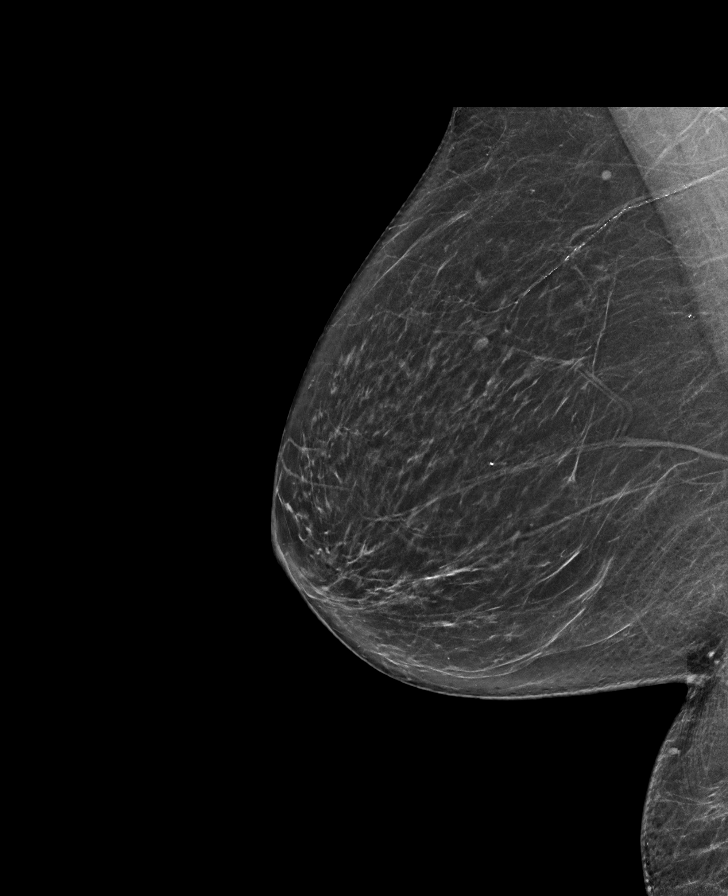

[R CC tomo · tomo slice 35/70.0]
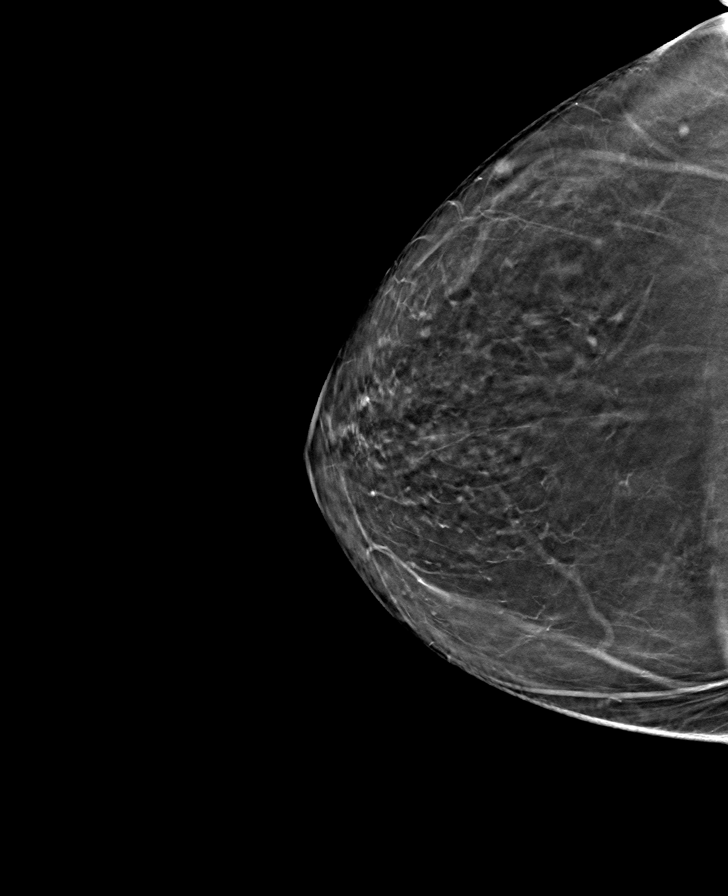

[R MLO tomo · tomo slice 33/66.0]
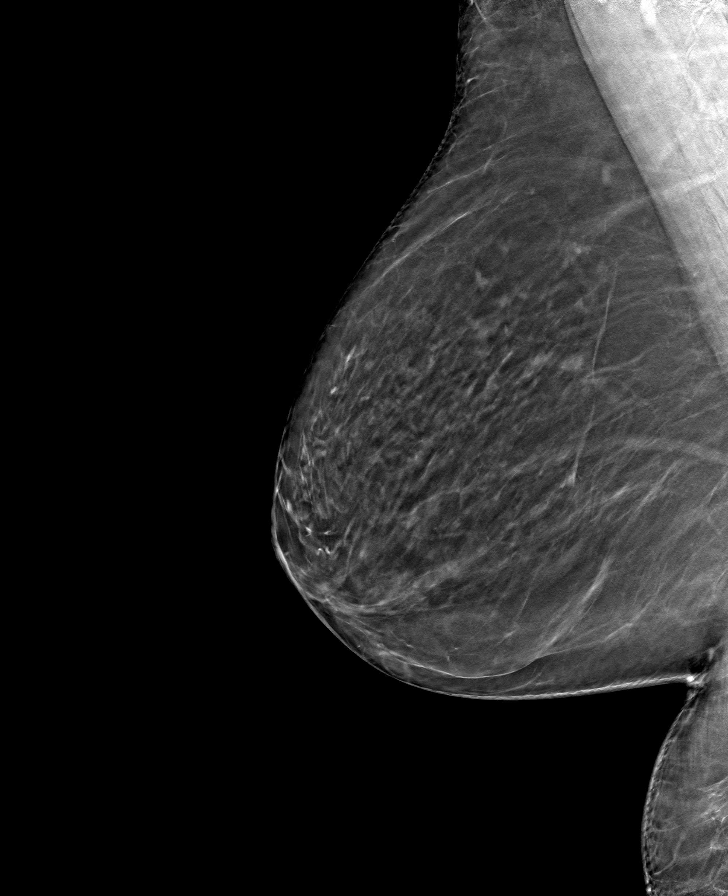

[L MLO tomo · tomo slice 33/66.0]
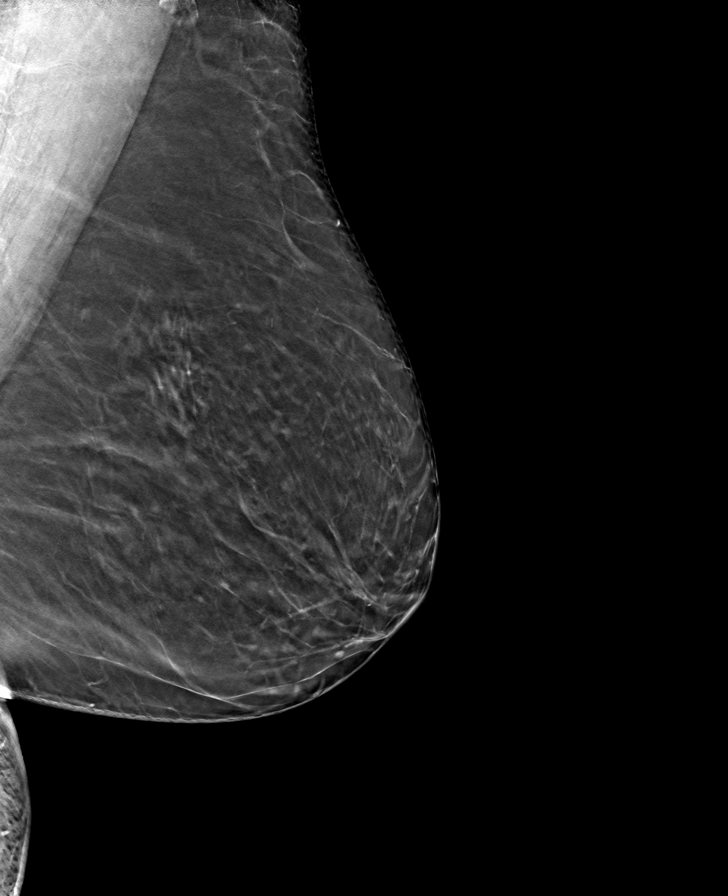

[L CC tomo · tomo slice 35/70.0]
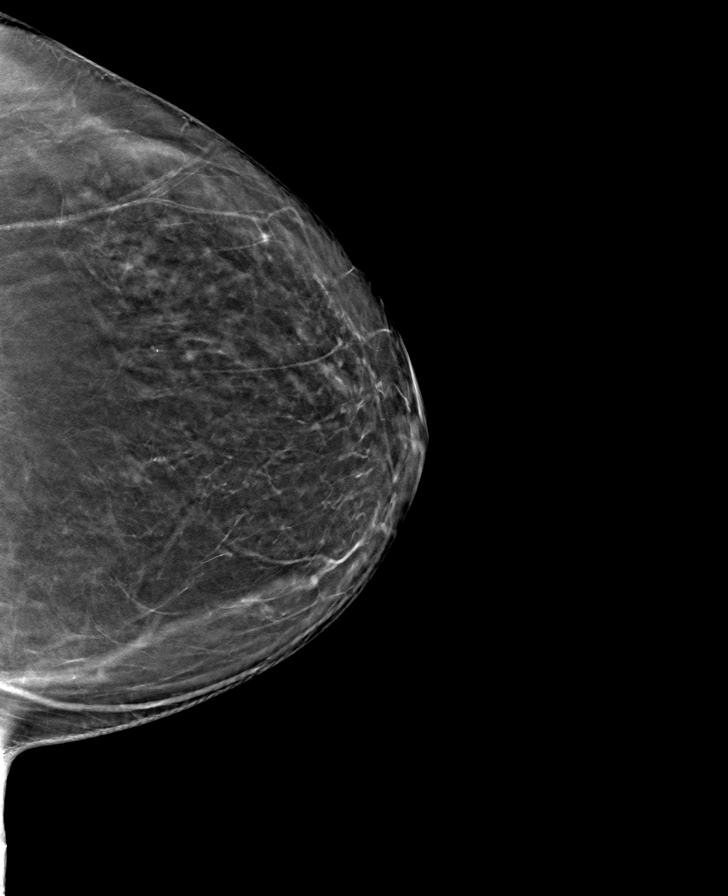

[8 of 24 positions shown; findings below may reference images not displayed]

ACR Breast Density Category b: There are scattered areas of
fibroglandular density.
FINDINGS: There are no findings suspicious for malignancy.
IMPRESSION: No mammographic evidence of malignancy. A result letter of this
screening mammogram will be mailed directly to the patient.

RECOMMENDATION:
Screening mammogram in one year. (Code:51-O-LD2)

BI-RADS CATEGORY  1: Negative.

## 2022-09-12 ENCOUNTER — Other Ambulatory Visit: Payer: Self-pay

## 2022-09-12 MED ORDER — ICOSAPENT ETHYL 1 G PO CAPS: 2.0000 g | ORAL_CAPSULE | Freq: Two times a day (BID) | ORAL | 3 refills | Status: DC

## 2022-10-02 ENCOUNTER — Ambulatory Visit
Admission: RE | Admit: 2022-10-02 | Discharge: 2022-10-02 | Disposition: A | Discharge status: Home / Self Care | Payer: Medicare Other | Source: Ambulatory Visit | Attending: Internal Medicine | Admitting: Internal Medicine

## 2022-10-02 DIAGNOSIS — Z1231 Encounter for screening mammogram for malignant neoplasm of breast: Secondary | ICD-10-CM | POA: Diagnosis not present

## 2022-11-01 ENCOUNTER — Telehealth: Payer: Self-pay | Admitting: Interventional Cardiology

## 2022-11-01 NOTE — Telephone Encounter (Signed)
Pt states that she has been having left shoulder pain for the last 2-3 days. Pt says that at times it's hard to lift that arm. She would like a callback regarding this matter.

## 2022-11-01 NOTE — Telephone Encounter (Signed)
Spoke with patient who reports left shoulder pain for last 2-3 days. She states it worsens with movement and she could not lift her arm. It improved when she took pain medication. Patient states she had a short episode of indigestion like pain that resolved with one nitroglycerin. She denies SOB and N/V.   Advised her that the shoulder pain presents more like a muscle skeletal concern and she should see her PCP or orthopedic provider for assessment.  Also advised that if shoulder pain worsens, other symptoms such as chest pain returns, she has increased SOB and/or NV she needs to go to he ED for evaluation.  Patient verbalized understanding and had no questions.    She has an appointment with Dr Miachel Roux on 11/13/22.

## 2022-11-12 NOTE — Progress Notes (Unsigned)
Cardiology Office Note   Date:  11/13/2022   ID:  [name], [name] 1945/12/12, MRN [id number]  PCP:  Corliss Blacker, MD    No chief complaint on file.  CAD  Wt Readings from Last 3 Encounters:  11/13/22 143 lb 6.4 oz (65 kg)  05/18/21 138 lb (62.6 kg)  09/15/19 151 lb 12.8 oz (68.9 kg)       History of Present Illness: [name] is a 77 y.o. female   who has had multivessel stents placed, in 10-Dec-2001 and 12/11/07. She had a proximal LAD stent placed in 10-Dec-2001, 3.0 x 18 [place].  She had SHOB prior to her stents and an abnormal ETT in 2001/12/10. She did have chest pain prior to the the first stent being placed. She woke up with chest pain on one occasion before the stents, and the other was [facility] with exertion back in Dec 10, 2001.    She had stents to the RCA and ramus in 12/11/07.     Husband was ill with cancer in Dec 10, 2016.  Ultimately, he passed away.   Due to angina, had a nuclear stress test in 12/10/2016 showing: "Nuclear stress EF: 85%. Blood pressure demonstrated a normal response to exercise. There was no ST segment deviation noted during stress. The study is normal. The left ventricular ejection fraction is hyperdynamic (>65%).   1. EF > 60%, normal wall motion.  2. No ischemia or infarction by perfusion images.  3. Normal exercise tolerance with no ischemic ST changes on exercise ECG.    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   She has had to hold Plavix for 5 days in the past for colonoscopy as well as teeth removal.  She reports some left arm pain and pain under left breast.  Not associated with exertion.   Walks 40 minutes, never has to stop.  Walks at work [employer]).  No sx like prior angina which was "like someone sitting on my chest."  Denies : Chest pain. Dizziness. Leg edema. Orthopnea. Palpitations. Paroxysmal nocturnal dyspnea. Shortness of breath. Syncope.    Had some shoulder pain which seemed  orthopedic.  Still has some pain with moving the shoulder in  a certain.  This is also been an area where fibromyalgia tends to act up.  After some thought, she recalled her angina prior to her stents occurred while she was raking leaves and she felt significant pressure in her chest that she had to stop.  She has not had symptoms like that recently.  She continues to work in [employer].  Past Medical History:  Diagnosis Date   ASCVD (arteriosclerotic cardiovascular disease)    multivessel, s/p DE stent, LAD proximal and mid, large proximal ramus 12/23/2007---s/p DE stent implant, mid and proximal RCA 01/26/08   Coronary atherosclerosis of native coronary artery    Depression    Dyslipidemia    Essential hypertension, benign    Fibromyalgia syndrome    Fracture of right fibula    11/12   GERD (gastroesophageal reflux disease)    H/O cardiovascular stress test    abnormal   Tobacco use disorder     History reviewed. No pertinent surgical history.   Current Outpatient Medications  Medication Sig Dispense Refill   aspirin 81 MG tablet Take 1 tablet (81 mg total) by mouth daily.     atorvastatin (LIPITOR) 80 MG tablet TAKE ONE TABLET BY MOUTH  EVERY MORNING 30 tablet 0   cholecalciferol (VITAMIN D) 1000 UNITS tablet Take 1,000 Units by mouth daily.     clopidogrel (PLAVIX) 75 MG tablet Take 1 tablet (75 mg total) by mouth daily. Please keep upcoming appointment in April for future refills. 30 tablet 3   FLUoxetine (PROZAC) 20 MG capsule Take 20 mg by mouth daily.     gabapentin (NEURONTIN) 100 MG capsule Take 100 mg by mouth 3 (three) times daily.     HYDROcodone-acetaminophen (NORCO/VICODIN) 5-325 MG per tablet Take 1 tablet by mouth every 6 (six) hours as needed for moderate pain.     icosapent Ethyl (VASCEPA) 1 g capsule Take 2 capsules (2 g total) by mouth 2 (two) times daily. Please keep upcoming appointment in April for future refills. 120 capsule 3   isosorbide mononitrate (IMDUR)  30 MG 24 hr tablet Take 1 tablet (30 mg total) by mouth daily. Please keep upcoming appointment in April for future refills. 30 tablet 3   lisinopril (ZESTRIL) 20 MG tablet Take 1 tablet (20 mg total) by mouth daily. Please keep upcoming appointment in April for future refills. 30 tablet 3   LORazepam (ATIVAN) 1 MG tablet Take 1 mg by mouth daily. Take 0.5 in the morning and 0.5 in the evening.     nitroGLYCERIN (NITROSTAT) 0.4 MG SL tablet Place 1 tablet (0.4 mg total) under the tongue every 5 (five) minutes as needed for chest pain. X 3 doses 25 tablet 3   No current facility-administered medications for this visit.    Allergies:   Morphine and related and Other    Social History:  The patient  reports that she has been smoking. She has never used smokeless tobacco. She reports current alcohol use. She reports that she does not use drugs.   Family History:  The patient's family history includes CAD in her father; CVA in her brother and father; Heart attack in her sister; Heart failure in her father; Hypertension in her brother; Ovarian cancer in her mother.    ROS:  Please see the history of present illness.   Otherwise, review of systems are positive for shoulder pain.   All other systems are reviewed and negative.    PHYSICAL EXAM: VS:  BP 118/60   Pulse 69   Ht 5\' 2"  (1.575 m)   Wt 143 lb 6.4 oz (65 kg)   SpO2 95%   BMI 26.23 kg/m  , BMI Body mass index is 26.23 kg/m. GEN: Well nourished, well developed, in no acute distress HEENT: normal Neck: no JVD, carotid bruits, or masses Cardiac: RRR; soft 1/6 early systolic  murmur, no rubs, or gallops,no edema  Respiratory:  clear to auscultation bilaterally, normal work of breathing GI: soft, nontender, nondistended, + BS MS: no deformity or atrophy Skin: warm and dry, no rash Neuro:  Strength and sensation are intact Psych: euthymic mood, full affect   EKG:   The ekg ordered today demonstrates normal ECG   Recent Labs: No  results found for requested labs within last 365 days.   Lipid Panel    Component Value Date/Time   CHOL 160 05/18/2021 1116   CHOL 142 05/31/2014 0853   TRIG 220 (H) 05/18/2021 1116   TRIG 256 (H) 05/31/2014 0853   HDL 54 05/18/2021 1116   HDL 59 05/31/2014 0853   CHOLHDL 3.0 05/18/2021 1116   CHOLHDL 2.8 12/11/2015 0840   VLDL 59 (H) 12/11/2015 0840   LDLCALC 70 05/18/2021 1116  [place] 32 05/31/2014 0853     Other studies Reviewed: Additional studies/ records that were reviewed today with results demonstrating: Labs reviewed.  A1c 5.9 in November 2023 total cholesterol 159 HDL 61 LDL 59 triglycerides 246   ASSESSMENT AND PLAN:  CAD: Status post remote stenting, last cath in 2009.  Some atypical shoulder pains.  We had a long discussion about cardiac cath versus stress testing.  Since her symptoms are not like what she had in the past, will plan for stress testing.  If her stress test is abnormal, cardiac cath may be needed.  We did discuss the risks and benefits of cardiac catheterization.  She would be agreeable if it is needed in the future but for now, we will plan for PET/CT stress test.  Her indigestion also has some atypical features.  It occurs with swallowing certain types of foods.  It is not related to exertion. Hyperlipidemia: well controlled Tobacco abuse: needs to avoid all tobacco products Prediabetes: Healthy diet.  Exercise target as noted below.  The patient understands that risks include but are not limited to stroke (1 in 1000), death (1 in 58), kidney failure [usually temporary] (1 in 500), bleeding (1 in 200), allergic reaction [possibly serious] (1 in 200), and agrees to proceed.   Current medicines are reviewed at length with the patient today.  The patient concerns regarding her medicines were addressed.  The following changes have been made:  No change  Labs/ tests ordered today include:   Orders Placed This Encounter  Procedures   NM PET CT  CARDIAC PERFUSION MULTI W/ABSOLUTE BLOODFLOW   EKG 12-Lead    Recommend 150 minutes/week of aerobic exercise Low fat, low carb, high fiber diet recommended  Disposition:   FU in 1 year, or sooner if the stress test is abnormal   Signed, Larae Grooms, MD  11/13/2022 11:31 AM    [facility] [place], [place], [place]  [address] Phone: [phone]; Fax: [phone]

## 2022-11-13 ENCOUNTER — Encounter: Payer: Self-pay | Admitting: Interventional Cardiology

## 2022-11-13 ENCOUNTER — Ambulatory Visit: Payer: Medicare Other | Attending: Interventional Cardiology | Admitting: Interventional Cardiology

## 2022-11-13 VITALS — BP 118/60 | HR 69 | Ht 62.0 in | Wt 143.4 lb

## 2022-11-13 DIAGNOSIS — Z720 Tobacco use: Secondary | ICD-10-CM

## 2022-11-13 DIAGNOSIS — R739 Hyperglycemia, unspecified: Secondary | ICD-10-CM

## 2022-11-13 DIAGNOSIS — E782 Mixed hyperlipidemia: Secondary | ICD-10-CM

## 2022-11-13 DIAGNOSIS — I25118 Atherosclerotic heart disease of native coronary artery with other forms of angina pectoris: Secondary | ICD-10-CM | POA: Diagnosis not present

## 2022-11-13 NOTE — Patient Instructions (Addendum)
Medication Instructions:  Your physician recommends that you continue on your current medications as directed. Please refer to the Current Medication list given to you today.  *If you need a refill on your cardiac medications before your next appointment, please call your pharmacy*   Lab Work: none If you have labs (blood work) drawn today and your tests are completely normal, you will receive your results only by: [place] (if you have MyChart) OR A paper copy in the mail If you have any lab test that is abnormal or we need to change your treatment, we will call you to review the results.   Testing/Procedures: Dr Irish Lack recommends you have a Cardiac PET Scan   Follow-Up: At [facility], you and your health needs are our priority.  As part of our continuing mission to provide you with exceptional heart care, we have created designated Provider Care Teams.  These Care Teams include your primary Cardiologist (physician) and Advanced Practice Providers (APPs -  Physician Assistants and Nurse Practitioners) who all work together to provide you with the care you need, when you need it.  We recommend signing up for the patient portal called "MyChart".  Sign up information is provided on this After Visit Summary.  MyChart is used to connect with patients for Virtual Visits (Telemedicine).  Patients are able to view lab/test results, encounter notes, upcoming appointments, etc.  Non-urgent messages can be sent to your provider as well.   To learn more about what you can do with MyChart, go to [email or web address].    Your next appointment:   12 month(s)  Provider:   Larae Grooms, MD     Other Instructions  How to Prepare for Your Cardiac PET/CT Stress Test:  1. Please do not take these medications before your test:   Medications that may interfere with the cardiac pharmacological stress agent (ex. nitrates - including erectile dysfunction medications, isosorbide  mononitrate, tamulosin or beta-blockers) the day of the exam. (Erectile dysfunction medication should be held for at least 72 hrs prior to test)  Do not take isosorbide mononitrate the day of the test Theophylline containing medications for 12 hours. Dipyridamole 48 hours prior to the test. Your remaining medications may be taken with water.  2. Nothing to eat or drink, except water, 3 hours prior to arrival time.   NO caffeine/decaffeinated products, or chocolate 12 hours prior to arrival.  3. NO perfume, cologne or lotion  4. Total time is 1 to 2 hours; you may want to bring reading material for the waiting time.  5. Please report to Radiology at the [place] Entrance 30 minutes early for your test.  [place], [place] [address]  IF YOU THINK YOU MAY BE PREGNANT, OR ARE NURSING PLEASE INFORM THE TECHNOLOGIST.  In preparation for your appointment, medication and supplies will be purchased.  Appointment availability is limited, so if you need to cancel or reschedule, please call the Radiology Department at [phone]  24 hours in advance to avoid a cancellation fee of $100.00  What to Expect After you Arrive:  Once you arrive and check in for your appointment, you will be taken to a preparation room within the Radiology Department.  A technologist or Nurse will obtain your medical history, verify that you are correctly prepped for the exam, and explain the procedure.  Afterwards,  an IV will be started in your arm and electrodes will be placed on your skin for EKG monitoring  during the stress portion of the exam. Then you will be escorted to the PET/CT scanner.  There, staff will get you positioned on the scanner and obtain a blood pressure and EKG.  During the exam, you will continue to be connected to the EKG and blood pressure machines.  A small, safe amount of a radioactive tracer will be injected in your IV to obtain a series of pictures of your heart  along with an injection of a stress agent.    After your Exam:  It is recommended that you eat a meal and drink a caffeinated beverage to counter act any effects of the stress agent.  Drink plenty of fluids for the remainder of the day and urinate frequently for the first couple of hours after the exam.  Your doctor will inform you of your test results within 7-10 business days.  For questions about your test or how to prepare for your test, please call: Marchia Bond, Cardiac Imaging Nurse Navigator  Gordy Clement, Cardiac Imaging Nurse Navigator Office: [phone]

## 2022-11-20 DIAGNOSIS — I25119 Atherosclerotic heart disease of native coronary artery with unspecified angina pectoris: Secondary | ICD-10-CM | POA: Diagnosis not present

## 2022-11-20 DIAGNOSIS — M5431 Sciatica, right side: Secondary | ICD-10-CM | POA: Diagnosis not present

## 2022-11-20 DIAGNOSIS — I10 Essential (primary) hypertension: Secondary | ICD-10-CM | POA: Diagnosis not present

## 2022-12-10 ENCOUNTER — Other Ambulatory Visit: Payer: Self-pay | Admitting: Interventional Cardiology

## 2022-12-20 ENCOUNTER — Other Ambulatory Visit: Payer: Self-pay | Admitting: *Deleted

## 2022-12-20 DIAGNOSIS — I25118 Atherosclerotic heart disease of native coronary artery with other forms of angina pectoris: Secondary | ICD-10-CM

## 2022-12-23 ENCOUNTER — Telehealth (HOSPITAL_COMMUNITY): Payer: Self-pay | Admitting: *Deleted

## 2022-12-23 NOTE — Telephone Encounter (Signed)
Attempted to call patient regarding upcoming cardiac PET appointment. Left message on voicemail with name and callback number  Larey Brick RN Navigator Cardiac Imaging [facility] [phone] Office [phone] Cell  Reminder to hold Imdur and caffeine prior to her cardiac PET scan.

## 2022-12-24 ENCOUNTER — Telehealth (HOSPITAL_COMMUNITY): Payer: Self-pay | Admitting: *Deleted

## 2022-12-24 NOTE — Telephone Encounter (Signed)
Reaching out to patient to offer assistance regarding upcoming cardiac imaging study; pt verbalizes understanding of appt date/time, parking situation and where to check in, pre-test NPO status  and verified current allergies; name and call back number provided for further questions should they arise  Advit Trethewey RN Navigator Cardiac Imaging [facility] [phone] office [phone] cell  Patient aware to avoid caffeine 12 hours prior to her cardiac PET scan. 

## 2022-12-25 ENCOUNTER — Encounter (HOSPITAL_COMMUNITY)
Admission: RE | Admit: 2022-12-25 | Discharge: 2022-12-25 | Disposition: A | Discharge status: Home / Self Care | Payer: Medicare Other | Source: Ambulatory Visit | Attending: Interventional Cardiology | Admitting: Interventional Cardiology

## 2022-12-25 DIAGNOSIS — R739 Hyperglycemia, unspecified: Secondary | ICD-10-CM | POA: Diagnosis not present

## 2022-12-25 DIAGNOSIS — Z720 Tobacco use: Secondary | ICD-10-CM

## 2022-12-25 DIAGNOSIS — E782 Mixed hyperlipidemia: Secondary | ICD-10-CM | POA: Insufficient documentation

## 2022-12-25 DIAGNOSIS — I25118 Atherosclerotic heart disease of native coronary artery with other forms of angina pectoris: Secondary | ICD-10-CM

## 2022-12-25 LAB — NM PET CT CARDIAC PERFUSION MULTI W/ABSOLUTE BLOODFLOW
MBFR: 2.06
Nuc Rest EF: 73 %
Nuc Stress EF: 75 %
Rest MBF: 0.88 ml/g/min
Rest Nuclear Isotope Dose: 16.9 mCi
ST Depression (mm): 0 mm
Stress MBF: 1.81 ml/g/min
Stress Nuclear Isotope Dose: 16.6 mCi
TID: 1.26

## 2022-12-25 MED ORDER — REGADENOSON 0.4 MG/5ML IV SOLN
0.4000 mg | Freq: Once | INTRAVENOUS | Status: AC
  Administered 2022-12-25: 0.4 mg via INTRAVENOUS

## 2022-12-25 MED ORDER — RUBIDIUM RB82 GENERATOR (RUBYFILL)
17.0000 | PACK | Freq: Once | INTRAVENOUS | Status: AC
  Administered 2022-12-25: 16.6 via INTRAVENOUS

## 2022-12-25 MED ORDER — REGADENOSON 0.4 MG/5ML IV SOLN
INTRAVENOUS | Status: AC
  Filled 2022-12-25: qty 5

## 2022-12-25 MED ORDER — RUBIDIUM RB82 GENERATOR (RUBYFILL)
17.0000 | PACK | Freq: Once | INTRAVENOUS | Status: AC
  Administered 2022-12-25: 16.9 via INTRAVENOUS

## 2023-01-08 ENCOUNTER — Telehealth (HOSPITAL_COMMUNITY): Payer: Self-pay | Admitting: Emergency Medicine

## 2023-01-08 NOTE — Telephone Encounter (Signed)
Patient calling to get results from PET scan on 12/25/22  Call back number is [phone]  Rockwell Alexandria RN Navigator Cardiac Imaging [facility] [phone] Office  [phone] Cell

## 2023-01-08 NOTE — Telephone Encounter (Signed)
I spoke with patient and reviewed test results with her.

## 2023-01-08 NOTE — Telephone Encounter (Signed)
Left message to call office

## 2023-01-08 NOTE — Telephone Encounter (Signed)
LV perfusion is normal. There is no evidence of ischemia. There is no evidence of infarction.   Rest left ventricular function is normal. Rest EF: 73 %. Stress left ventricular function is normal. Stress EF: 75 %. End diastolic cavity size is normal. End systolic cavity size is normal.

## 2023-04-02 ENCOUNTER — Ambulatory Visit
Admission: RE | Admit: 2023-04-02 | Discharge: 2023-04-02 | Disposition: A | Discharge status: Home / Self Care | Payer: Medicare Other | Source: Ambulatory Visit | Attending: Internal Medicine | Admitting: Internal Medicine

## 2023-04-02 ENCOUNTER — Other Ambulatory Visit: Payer: Self-pay | Admitting: Internal Medicine

## 2023-04-02 DIAGNOSIS — R058 Other specified cough: Secondary | ICD-10-CM

## 2023-08-08 ENCOUNTER — Other Ambulatory Visit: Payer: Self-pay | Admitting: Internal Medicine

## 2023-08-08 DIAGNOSIS — M8589 Other specified disorders of bone density and structure, multiple sites: Secondary | ICD-10-CM

## 2023-10-07 ENCOUNTER — Other Ambulatory Visit: Payer: Self-pay | Admitting: Internal Medicine

## 2023-10-07 DIAGNOSIS — Z1231 Encounter for screening mammogram for malignant neoplasm of breast: Secondary | ICD-10-CM

## 2023-10-15 ENCOUNTER — Ambulatory Visit
Admission: RE | Admit: 2023-10-15 | Discharge: 2023-10-15 | Disposition: A | Discharge status: Home / Self Care | Payer: Medicare Other | Source: Ambulatory Visit | Attending: Internal Medicine | Admitting: Internal Medicine

## 2023-10-15 DIAGNOSIS — Z1231 Encounter for screening mammogram for malignant neoplasm of breast: Secondary | ICD-10-CM

## 2023-11-19 ENCOUNTER — Encounter: Payer: Self-pay | Admitting: Cardiology

## 2023-11-19 ENCOUNTER — Telehealth: Payer: Self-pay

## 2023-11-19 ENCOUNTER — Ambulatory Visit: Payer: Medicare Other | Attending: Cardiology | Admitting: Cardiology

## 2023-11-19 VITALS — BP 120/76 | HR 70 | Resp 16 | Ht 62.0 in | Wt 144.8 lb

## 2023-11-19 DIAGNOSIS — E782 Mixed hyperlipidemia: Secondary | ICD-10-CM | POA: Diagnosis not present

## 2023-11-19 DIAGNOSIS — I2510 Atherosclerotic heart disease of native coronary artery without angina pectoris: Secondary | ICD-10-CM | POA: Diagnosis not present

## 2023-11-19 DIAGNOSIS — I10 Essential (primary) hypertension: Secondary | ICD-10-CM | POA: Diagnosis not present

## 2023-11-19 LAB — LIPID PANEL
Chol/HDL Ratio: 2.3 ratio (ref 0.0–4.4)
Cholesterol, Total: 136 mg/dL (ref 100–199)
HDL: 60 mg/dL (ref >39)
LDL Chol Calc (NIH): 51 mg/dL (ref 0–99)
Triglycerides: 145 mg/dL (ref 0–149)
VLDL Cholesterol Cal: 25 mg/dL (ref 5–40)

## 2023-11-19 MED ORDER — CLOPIDOGREL BISULFATE 75 MG PO TABS: 75.0000 mg | ORAL_TABLET | Freq: Every day | ORAL | 3 refills | Status: DC

## 2023-11-19 MED ORDER — NITROGLYCERIN 0.4 MG SL SUBL: 0.4000 mg | SUBLINGUAL_TABLET | SUBLINGUAL | 3 refills | Status: AC | PRN

## 2023-11-19 MED ORDER — ATORVASTATIN CALCIUM 80 MG PO TABS
80.0000 mg | ORAL_TABLET | Freq: Every morning | ORAL | 0 refills | Status: AC
Start: 2023-11-19 — End: ?

## 2023-11-19 MED ORDER — LISINOPRIL 20 MG PO TABS: 20.0000 mg | ORAL_TABLET | Freq: Every day | ORAL | 3 refills | Status: AC

## 2023-11-19 MED ORDER — ISOSORBIDE MONONITRATE ER 30 MG PO TB24: 30.0000 mg | ORAL_TABLET | Freq: Every day | ORAL | 3 refills | Status: DC

## 2023-11-19 MED ORDER — ICOSAPENT ETHYL 1 G PO CAPS: ORAL_CAPSULE | ORAL | 3 refills | Status: AC

## 2023-11-19 NOTE — Patient Instructions (Signed)
 Medication Instructions:  STOP Aspirin   Refills sent in today   *If you need a refill on your cardiac medications before your next appointment, please call your pharmacy*  Lab Work: Fasting lipid panel   If you have labs (blood work) drawn today and your tests are completely normal, you will receive your results only by: MyChart Message (if you have MyChart) OR A paper copy in the mail If you have any lab test that is abnormal or we need to change your treatment, we will call you to review the results.   Follow-Up: At [facility], you and your health needs are our priority.  As part of our continuing mission to provide you with exceptional heart care, our providers are all part of one team.  This team includes your primary Cardiologist (physician) and Advanced Practice Providers or APPs (Physician Assistants and Nurse Practitioners) who all work together to provide you with the care you need, when you need it.  Your next appointment:   1 year(s)  Provider:   Elder Negus, MD     We recommend signing up for the patient portal called "MyChart".  Sign up information is provided on this After Visit Summary.  MyChart is used to connect with patients for Virtual Visits (Telemedicine).  Patients are able to view lab/test results, encounter notes, upcoming appointments, etc.  Non-urgent messages can be sent to your provider as well.   To learn more about what you can do with MyChart, go to [email or web address].   Other Instructions       1st Floor: - Lobby - Registration  - Pharmacy  - Lab - Cafe  2nd Floor: - PV Lab - Diagnostic Testing (echo, CT, nuclear med)  3rd Floor: - Vacant  4th Floor: - TCTS (cardiothoracic surgery) - AFib Clinic - Structural Heart Clinic - Vascular Surgery  - Vascular Ultrasound  5th Floor: - HeartCare Cardiology (general and EP) - Clinical Pharmacy for coumadin, hypertension, lipid, weight-loss medications, and med  management appointments    Valet parking services will be available as well.

## 2023-11-19 NOTE — Telephone Encounter (Signed)
 Left message to return call. I am needing the address of the pharmacy that she is wanting her medications to be sent.

## 2023-11-19 NOTE — Progress Notes (Signed)
 Cardiology Office Note:  .   Date:  11/19/2023  ID:  [name], DOB 1945-08-28, MRN [id number] PCP: Thana Ates, MD  [facility] Providers Cardiologist:  Truett Mainland, MD PCP: Thana Ates, MD  Chief Complaint  Patient presents with   Coronary artery disease involving native coronary artery of   Follow-up     [name] is a 78 y.o. female with hyperlipidemia, prediabetes, occasional smoker, CAD, prior PCI in 2009  Patient was last seen by Dr. Eldridge Dace in 11/2022.  Given her chest pain symptoms, she underwent PET/CT stress testing.  There was no regional perfusion abnormality, but 3 times daily was mildly elevated raising possible concern for balanced ischemia.  It does not appear that she underwent cardiac catheterization at that time.   Patient is doing well.  She reports borderline at rest.  Denies any regular complains of chest pain or shortness of air.  She had 1 episode of left arm pain 6 months ago that resolved with replacement, but has not occurred since.     Vitals:   11/19/23 0934  BP: 120/76  Pulse: 70  Resp: 16  SpO2: 96%      Review of Systems  Cardiovascular:  Negative for chest pain, dyspnea on exertion, leg swelling, palpitations and syncope.        Studies Reviewed: Marland Kitchen        EKG 11/19/2023: Normal sinus rhythm with sinus arrhythmia Normal ECG When compared with ECG of 27-Jan-2008 07:22, No significant change was found    Independently interpreted 07/2023: Chol 163, TG 296, HDL 65, LDL 53 HbA1C 6.1% Hb 14.4 Cr 0.8 TSH 0.8   Physical Exam Vitals and nursing note reviewed.  Constitutional:      General: She is not in acute distress. Neck:     Vascular: No JVD.  Cardiovascular:     Rate and Rhythm: Normal rate and regular rhythm.     Heart sounds: Normal heart sounds. No murmur heard. Pulmonary:     Effort: Pulmonary effort is normal.     Breath sounds: Normal breath sounds. No wheezing or rales.   Musculoskeletal:     Right lower leg: No edema.     Left lower leg: No edema.      VISIT DIAGNOSES:   ICD-10-CM   1. Coronary artery disease involving native coronary artery of native heart without angina pectoris  I25.10 EKG 12-Lead    2. Hyperlipidemia, mixed  E78.2 Lipid panel    3. Primary hypertension  I10        [name] is a 78 y.o. female with hyperlipidemia, prediabetes, occasional smoker, CAD, prior PCI in 2009  Assessment & Plan  CAD: No clear anginal symptoms.  PET/CT stress test in 2024 with activation perfusion abnormality, possibly mild 3 times daily.  However, in absence of symptoms, continue medical management for now. Does not need DAPT at this point, stopped aspirin and continue Plavix monotherapy. Continue Imdur 30 mg daily. Triglycerides elevated in 08/2023 in spite of being on Vascepa. Check fasting lipid panel. Smokes only occasionally, emphasized importance of complete cessation  Hypertension: Continue lisinopril 20 mg daily.  Mixed hyperlipidemia: Continue Lipitor 80 mg daily, Vascepa 2 g twice daily. Check fasting lipid panel.        Meds ordered this encounter  Medications   atorvastatin (LIPITOR) 80 MG tablet    Sig: Take 1 tablet (80 mg total) by mouth every morning.    Dispense:  30  tablet    Refill:  0    Please call our office to schedule an overdue appointment with Dr. Eldridge Dace before anymore refills. [phone]. Thank you 1st attempt   clopidogrel (PLAVIX) 75 MG tablet    Sig: Take 1 tablet (75 mg total) by mouth daily.    Dispense:  90 tablet    Refill:  3    This prescription was filled on 11/20/2022. Any refills authorized will be placed on file.   icosapent Ethyl (VASCEPA) 1 g capsule    Sig: TAKE TWO CAPSULES BY MOUTH TWICE DAILY.    Dispense:  360 capsule    Refill:  3    This prescription was filled on 11/20/2022. Any refills authorized will be placed on file.   isosorbide mononitrate (IMDUR) 30 MG 24 hr  tablet    Sig: Take 1 tablet (30 mg total) by mouth daily.    Dispense:  90 tablet    Refill:  3   lisinopril (ZESTRIL) 20 MG tablet    Sig: Take 1 tablet (20 mg total) by mouth daily.    Dispense:  90 tablet    Refill:  3   nitroGLYCERIN (NITROSTAT) 0.4 MG SL tablet    Sig: Place 1 tablet (0.4 mg total) under the tongue every 5 (five) minutes as needed for chest pain. X 3 doses    Dispense:  25 tablet    Refill:  3     F/u in 1 year  Signed, Elder Negus, MD

## 2023-11-21 ENCOUNTER — Encounter: Payer: Self-pay | Admitting: Cardiology

## 2023-11-24 ENCOUNTER — Other Ambulatory Visit: Payer: Self-pay | Admitting: Interventional Cardiology

## 2024-01-01 DIAGNOSIS — I25119 Atherosclerotic heart disease of native coronary artery with unspecified angina pectoris: Secondary | ICD-10-CM | POA: Diagnosis not present

## 2024-01-01 DIAGNOSIS — I10 Essential (primary) hypertension: Secondary | ICD-10-CM | POA: Diagnosis not present

## 2024-01-01 DIAGNOSIS — M5431 Sciatica, right side: Secondary | ICD-10-CM | POA: Diagnosis not present

## 2024-01-10 DIAGNOSIS — E782 Mixed hyperlipidemia: Secondary | ICD-10-CM | POA: Diagnosis not present

## 2024-01-10 DIAGNOSIS — I25119 Atherosclerotic heart disease of native coronary artery with unspecified angina pectoris: Secondary | ICD-10-CM | POA: Diagnosis not present

## 2024-03-11 DIAGNOSIS — I25119 Atherosclerotic heart disease of native coronary artery with unspecified angina pectoris: Secondary | ICD-10-CM | POA: Diagnosis not present

## 2024-03-11 DIAGNOSIS — E782 Mixed hyperlipidemia: Secondary | ICD-10-CM | POA: Diagnosis not present

## 2024-04-02 ENCOUNTER — Other Ambulatory Visit: Payer: Medicare Other

## 2024-04-11 DIAGNOSIS — I25119 Atherosclerotic heart disease of native coronary artery with unspecified angina pectoris: Secondary | ICD-10-CM | POA: Diagnosis not present

## 2024-04-11 DIAGNOSIS — E782 Mixed hyperlipidemia: Secondary | ICD-10-CM | POA: Diagnosis not present

## 2024-05-11 DIAGNOSIS — E782 Mixed hyperlipidemia: Secondary | ICD-10-CM | POA: Diagnosis not present

## 2024-05-11 DIAGNOSIS — I25119 Atherosclerotic heart disease of native coronary artery with unspecified angina pectoris: Secondary | ICD-10-CM | POA: Diagnosis not present

## 2024-07-19 ENCOUNTER — Other Ambulatory Visit (HOSPITAL_BASED_OUTPATIENT_CLINIC_OR_DEPARTMENT_OTHER): Payer: Self-pay | Admitting: Internal Medicine

## 2024-07-19 DIAGNOSIS — M8589 Other specified disorders of bone density and structure, multiple sites: Secondary | ICD-10-CM
